# Patient Record
Sex: Male | Born: 1970 | Race: White | Hispanic: No | Smoking: Never smoker
Health system: Southern US, Community
[De-identification: ages and names within clinical notes are randomized; demographics above are authoritative.]

## PROBLEM LIST (undated history)

## (undated) DIAGNOSIS — R011 Cardiac murmur, unspecified: Secondary | ICD-10-CM

## (undated) DIAGNOSIS — J45909 Unspecified asthma, uncomplicated: Secondary | ICD-10-CM

---

## 2001-04-15 ENCOUNTER — Emergency Department (HOSPITAL_COMMUNITY): Admission: EM | Admit: 2001-04-15 | Discharge: 2001-04-16 | Payer: Self-pay | Admitting: *Deleted

## 2001-04-15 ENCOUNTER — Encounter: Payer: Self-pay | Admitting: Emergency Medicine

## 2018-06-30 ENCOUNTER — Encounter (HOSPITAL_BASED_OUTPATIENT_CLINIC_OR_DEPARTMENT_OTHER): Payer: Self-pay

## 2018-06-30 ENCOUNTER — Emergency Department (HOSPITAL_BASED_OUTPATIENT_CLINIC_OR_DEPARTMENT_OTHER)
Admission: EM | Admit: 2018-06-30 | Discharge: 2018-06-30 | Disposition: A | Discharge status: Home / Self Care | Payer: Managed Care, Other (non HMO) | Attending: Emergency Medicine | Admitting: Emergency Medicine

## 2018-06-30 ENCOUNTER — Emergency Department (HOSPITAL_BASED_OUTPATIENT_CLINIC_OR_DEPARTMENT_OTHER): Payer: Managed Care, Other (non HMO)

## 2018-06-30 ENCOUNTER — Other Ambulatory Visit: Payer: Self-pay

## 2018-06-30 DIAGNOSIS — R0989 Other specified symptoms and signs involving the circulatory and respiratory systems: Secondary | ICD-10-CM | POA: Insufficient documentation

## 2018-06-30 MED ORDER — LIDOCAINE VISCOUS HCL 2 % MT SOLN
15.0000 mL | Freq: Once | OROMUCOSAL | Status: AC
  Administered 2018-06-30: 15 mL via OROMUCOSAL
  Filled 2018-06-30: qty 15

## 2018-06-30 MED ORDER — LIDOCAINE VISCOUS HCL 2 % MT SOLN: 15.0000 mL | OROMUCOSAL | 0 refills | Status: DC | PRN

## 2018-06-30 NOTE — ED Notes (Signed)
No drooling Pt. Able to talk plain and is in no reported pain.

## 2018-06-30 NOTE — ED Notes (Signed)
Feels like fish bone in throat  Denies diff swallowing  Or talking

## 2018-06-30 NOTE — ED Notes (Signed)
Ate crackers without diff

## 2018-06-30 NOTE — ED Provider Notes (Signed)
[facility] EMERGENCY DEPARTMENT Provider Note   CSN: [id number] Arrival date & time: 06/30/18  1558    History   Chief Complaint Chief Complaint  Patient presents with  . FB in throat    HPI Johnny Weiss is a 48 y.o. male.     48 year old male with no past medical history presents emergency department for sensation of foreign body in the throat.  Patient reports that he was eating fish around 1 PM and felt like a fishbone might of got caught in his throat.  Reports that he began coughing some.  Denies passing out.  Reports since then he has felt a scratching sensation in the upper posterior part of the left side of his throat.  Denies difficulty breathing or swallowing.  He has been able to tolerate liquids but has not tried any solids yet.     History reviewed. No pertinent past medical history.  There are no active problems to display for this patient.   History reviewed. No pertinent surgical history.      Home Medications    Prior to Admission medications   Medication Sig Start Date End Date Taking? Authorizing Provider  lidocaine (XYLOCAINE) 2 % solution Use as directed 15 mLs in the mouth or throat as needed for mouth pain. 06/30/18   Alveria Apley, PA-C    Family History No family history on file.  Social History Social History   Tobacco Use  . Smoking status: Never Smoker  . Smokeless tobacco: Never Used  Substance Use Topics  . Alcohol use: Yes    Comment: occ  . Drug use: Never     Allergies   Patient has no known allergies.   Review of Systems Review of Systems  Constitutional: Negative for appetite change.  HENT: Positive for sore throat.   Respiratory: Negative for cough, choking, chest tightness and shortness of breath.   Gastrointestinal: Negative for nausea and vomiting.  Allergic/Immunologic: Negative for immunocompromised state.  Neurological: Negative for dizziness and light-headedness.  Hematological: Does not  bruise/bleed easily.     Physical Exam Updated Vital Signs BP (!) 154/91 (BP Location: Left Arm)   Pulse (!) 50 Comment: Per Patient. States that it is normal for him.  Temp 98.8 F (37.1 C) (Oral)   Resp 18   SpO2 100%   Physical Exam Vitals signs and nursing note reviewed.  Constitutional:      Appearance: Normal appearance.  HENT:     Head: Normocephalic.     Mouth/Throat:     Mouth: Mucous membranes are moist.     Tongue: No lesions.     Palate: No mass and lesions.     Pharynx: Oropharynx is clear. No oropharyngeal exudate or posterior oropharyngeal erythema.     Tonsils: No tonsillar exudate or tonsillar abscesses.  Eyes:     Conjunctiva/sclera: Conjunctivae normal.  Neck:     Musculoskeletal: No neck rigidity or pain with movement.     Vascular: No carotid bruit.     Trachea: Trachea and phonation normal. No tracheal tenderness or tracheal deviation.  Pulmonary:     Effort: Pulmonary effort is normal.  Lymphadenopathy:     Cervical: No cervical adenopathy.  Skin:    General: Skin is dry.  Neurological:     Mental Status: He is alert.  Psychiatric:        Mood and Affect: Mood normal.      ED Treatments / Results  Labs (all labs ordered are listed,  but only abnormal results are displayed) Labs Reviewed - No data to display  EKG None  Radiology Dg Neck Soft Tissue  Result Date: 06/30/2018 CLINICAL DATA:  Fishbone caught in throat EXAM: NECK SOFT TISSUES - 1+ VIEW COMPARISON:  None. FINDINGS: There is no evidence of retropharyngeal soft tissue swelling or epiglottic enlargement. The cervical airway is unremarkable and no radio-opaque foreign body identified. Within the scratch set just below the thoracic inlet at the approximate T2 level there is a linear structure which has a thickness of approximately 2 mm and is of uncertain significance. IMPRESSION: 1. No radiopaque foreign body identified within the soft tissues of the neck. At the level of the thoracic  inlet there is a 2 mm thick linear radiopaque structure which appears calcific in density and is of uncertain clinical significance. Recommend PA and lateral radiograph of the chest for further evaluation Electronically Signed   By: Kerby Moors M.D.   On: 06/30/2018 17:28    Procedures Procedures (including critical care time)  Medications Ordered in ED Medications  lidocaine (XYLOCAINE) 2 % viscous mouth solution 15 mL (15 mLs Mouth/Throat Given 06/30/18 1758)     Initial Impression / Assessment and Plan / ED Course  I have reviewed the triage vital signs and the nursing notes.  Pertinent labs & imaging results that were available during my care of the patient were reviewed by me and considered in my medical decision making (see chart for details).  Clinical Course as of Jun 29 1820  Mon Jun 30, 2018  1820 No foreign body consistent with fish bone in his trachea at the level where he is having irritation.  He was able to tolerate both liquids and solids here in the emergency department.  No trouble breathing, eating, swallowing.  There is a linear radiopaque structure at the thoracic inlet and is 2 mm.  In the past this has shown up on x-ray as well and has been consistent with calcification of thyroid cartilage.  Patient is not having any pain or symptoms in this area.  I discussed this with the patient as well.  Will send home with prescription for viscous lidocaine.  Advised on return precautions.   [KM]    Clinical Course User Index [KM] Alveria Apley, PA-C       Based on review of vitals, medical screening exam, lab work and/or imaging, there does not appear to be an acute, emergent etiology for the patient's symptoms. Counseled pt on good return precautions and encouraged both PCP and ED follow-up as needed.  Prior to discharge, I also discussed incidental imaging findings with patient in detail and advised appropriate, recommended follow-up in detail.  Clinical Impression:  1. Sensation of foreign body in throat     Disposition: Discharge  Prior to providing a prescription for a controlled substance, I independently reviewed the patient's recent prescription history on the [place]. The patient had no recent or regular prescriptions and was deemed appropriate for a brief, less than 3 day prescription of narcotic for acute analgesia.  This note was prepared with assistance of Systems analyst. Occasional wrong-word or sound-a-like substitutions may have occurred due to the inherent limitations of voice recognition software.    Final Clinical Impressions(s) / ED Diagnoses   Final diagnoses:  Sensation of foreign body in throat    ED Discharge Orders         Ordered    lidocaine (XYLOCAINE) 2 % solution  As needed     06/30/18 1817           Alveria Apley, PA-C 06/30/18 Tamala Fothergill, DO 06/30/18 1827

## 2018-06-30 NOTE — Discharge Instructions (Addendum)
Thank you for allowing me to care for you today. Please return to the emergency department if you have new or worsening symptoms.   

## 2018-06-30 NOTE — ED Triage Notes (Signed)
Pt states he feels he had a fish bone struck in throat since ~1pm-was seen at [facility] and advised to come to ED-pt NAD-steady gait

## 2020-02-10 DIAGNOSIS — Z23 Encounter for immunization: Secondary | ICD-10-CM | POA: Diagnosis not present

## 2020-04-11 DIAGNOSIS — Z01812 Encounter for preprocedural laboratory examination: Secondary | ICD-10-CM | POA: Diagnosis not present

## 2020-04-14 DIAGNOSIS — D124 Benign neoplasm of descending colon: Secondary | ICD-10-CM | POA: Diagnosis not present

## 2020-04-14 DIAGNOSIS — Z1211 Encounter for screening for malignant neoplasm of colon: Secondary | ICD-10-CM | POA: Diagnosis not present

## 2020-04-14 DIAGNOSIS — K5669 Other partial intestinal obstruction: Secondary | ICD-10-CM | POA: Diagnosis not present

## 2020-04-14 DIAGNOSIS — D127 Benign neoplasm of rectosigmoid junction: Secondary | ICD-10-CM | POA: Diagnosis not present

## 2020-04-14 DIAGNOSIS — K5730 Diverticulosis of large intestine without perforation or abscess without bleeding: Secondary | ICD-10-CM | POA: Diagnosis not present

## 2020-05-30 DIAGNOSIS — K635 Polyp of colon: Secondary | ICD-10-CM | POA: Diagnosis not present

## 2020-06-03 NOTE — Progress Notes (Signed)
Attempted to obtain medical history via telephone, unable to reach at this time. I left a voicemail to return pre surgical testing department's phone call.  

## 2020-06-06 ENCOUNTER — Other Ambulatory Visit (HOSPITAL_COMMUNITY)
Admission: RE | Admit: 2020-06-06 | Discharge: 2020-06-06 | Disposition: A | Discharge status: Home / Self Care | Payer: BC Managed Care – PPO | Source: Ambulatory Visit | Attending: Surgery | Admitting: Surgery

## 2020-06-06 DIAGNOSIS — Z20822 Contact with and (suspected) exposure to covid-19: Secondary | ICD-10-CM | POA: Insufficient documentation

## 2020-06-06 DIAGNOSIS — Z01812 Encounter for preprocedural laboratory examination: Secondary | ICD-10-CM | POA: Insufficient documentation

## 2020-06-06 DIAGNOSIS — D125 Benign neoplasm of sigmoid colon: Secondary | ICD-10-CM | POA: Diagnosis not present

## 2020-06-06 DIAGNOSIS — J45909 Unspecified asthma, uncomplicated: Secondary | ICD-10-CM | POA: Diagnosis not present

## 2020-06-06 LAB — SARS CORONAVIRUS 2 (TAT 6-24 HRS): SARS Coronavirus 2: NEGATIVE

## 2020-06-08 ENCOUNTER — Ambulatory Visit (HOSPITAL_COMMUNITY)
Admission: RE | Admit: 2020-06-08 | Discharge: 2020-06-08 | Disposition: A | Discharge status: Home / Self Care | Payer: BC Managed Care – PPO | Source: Ambulatory Visit | Attending: Surgery | Admitting: Surgery

## 2020-06-08 ENCOUNTER — Encounter (HOSPITAL_COMMUNITY): Payer: Self-pay | Admitting: Surgery

## 2020-06-08 ENCOUNTER — Ambulatory Visit (HOSPITAL_COMMUNITY): Payer: BC Managed Care – PPO | Admitting: Certified Registered"

## 2020-06-08 ENCOUNTER — Other Ambulatory Visit: Payer: Self-pay

## 2020-06-08 ENCOUNTER — Encounter (HOSPITAL_COMMUNITY)
Admission: RE | Disposition: A | Discharge status: Home / Self Care | Payer: Self-pay | Source: Ambulatory Visit | Attending: Surgery

## 2020-06-08 DIAGNOSIS — D125 Benign neoplasm of sigmoid colon: Secondary | ICD-10-CM | POA: Diagnosis not present

## 2020-06-08 DIAGNOSIS — J45909 Unspecified asthma, uncomplicated: Secondary | ICD-10-CM | POA: Diagnosis not present

## 2020-06-08 DIAGNOSIS — Z20822 Contact with and (suspected) exposure to covid-19: Secondary | ICD-10-CM | POA: Diagnosis not present

## 2020-06-08 DIAGNOSIS — D374 Neoplasm of uncertain behavior of colon: Secondary | ICD-10-CM | POA: Diagnosis not present

## 2020-06-08 DIAGNOSIS — K6389 Other specified diseases of intestine: Secondary | ICD-10-CM | POA: Diagnosis not present

## 2020-06-08 HISTORY — PX: BIOPSY: SHX5522

## 2020-06-08 HISTORY — PX: FLEXIBLE SIGMOIDOSCOPY: SHX5431

## 2020-06-08 SURGERY — SIGMOIDOSCOPY, FLEXIBLE
Anesthesia: Monitor Anesthesia Care

## 2020-06-08 MED ORDER — LACTATED RINGERS IV SOLN: INTRAVENOUS | Status: DC

## 2020-06-08 MED ORDER — SODIUM CHLORIDE 0.9 % IV SOLN: INTRAVENOUS | Status: DC

## 2020-06-08 MED ORDER — PROPOFOL 10 MG/ML IV BOLUS
INTRAVENOUS | Status: DC | PRN
  Administered 2020-06-08: 50 mg via INTRAVENOUS
  Administered 2020-06-08: 40 mg via INTRAVENOUS
  Administered 2020-06-08 (×3): 50 mg via INTRAVENOUS

## 2020-06-08 MED ORDER — LIDOCAINE 2% (20 MG/ML) 5 ML SYRINGE
INTRAMUSCULAR | Status: DC | PRN
  Administered 2020-06-08: 40 mg via INTRAVENOUS

## 2020-06-08 NOTE — Op Note (Signed)
[facility] Patient Name: Johnny Weiss Procedure Date: 06/08/2020 MRN: [id number] Attending MD: Ileana Roup MD, MD Date of Birth: 19-Aug-1970 CSN: [id number] Age: 50 Admit Type: Outpatient Procedure:                Flexible Sigmoidoscopy Indications:              Preoperative assessment Providers:                Sharon Mt. Tyreka Henneke MD, MD, Kary Kos RN, RN,                            Laverda Sorenson, Technician, Glenis Smoker, CRNA Referring MD:              Medicines:                Monitored Anesthesia Care Complications:            No immediate complications. Estimated Blood Loss:     Estimated blood loss was minimal. Procedure:                Pre-Anesthesia Assessment:                           - Monitored anesthesia care under the supervision                            of an anesthesiologist was determined to be                            medically necessary for this procedure based on                            review of the patient's medical history,                            medications, and prior anesthesia history.                           After obtaining informed consent, the scope was                            passed under direct vision. The PCF-H190DL                            ([id number]) Olympus pediatric colonscope was                            introduced through the anus and advanced to the the                            splenic flexure. The flexible sigmoidoscopy was                            accomplished without difficulty. The patient                            tolerated the procedure well. The quality  of the                            bowel preparation was adequate. Scope In: Scope Out: Findings:      The perianal and digital rectal examinations were normal. Pertinent       negatives include normal sphincter tone.      A 40 mm polypoid lesion was found in the distal-most sigmoid colon, just       above the proximal-most vavle of  [name]. This is too high for any       attempted trans-anal minimally invasive surgery (TAMIS). GI determined       not candidate for EMR and had referred for surgical evaluation. The       lesion was semi-sessile. No bleeding was present. This was biopsied with       a cold forceps for histology. Verification of patient identification for       the specimen was done by the nurse and technician using the patient's       name, birth date and medical record number. Estimated blood loss was       minimal. Impression:               - Rule out malignancy, polypoid lesion in the                            distal sigmoid colon. Biopsied. Moderate Sedation:      Not Applicable - Patient had care per Anesthesia. Recommendation:           - High fiber diet indefinitely.                           - Return to my office at the next available                            appointment.                           - Discharge patient to home. Procedure Code(s):        --- Professional ---                           [phone], Sigmoidoscopy, flexible; with biopsy, single                            or multiple Diagnosis Code(s):        --- Professional ---                           D49.0, Neoplasm of unspecified behavior of                            digestive system                           Z01.818, Encounter for other preprocedural                            examination CPT copyright 2019 American Medical Association. All rights reserved. The  codes documented in this report are preliminary and upon coder review may  be revised to meet current compliance requirements. [name], MD Ileana Roup MD, MD 06/08/2020 12:43:33 PM This report has been signed electronically. Number of Addenda: 0

## 2020-06-08 NOTE — Anesthesia Preprocedure Evaluation (Signed)
Anesthesia Evaluation  Patient identified by MRN, date of birth, ID band Patient awake    Reviewed: Allergy & Precautions, NPO status , Patient's Chart, lab work & pertinent test results, reviewed documented beta blocker date and time   Airway Mallampati: I  TM Distance: >3 FB Neck ROM: Full    Dental no notable dental hx. (+) Teeth Intact, Dental Advisory Given   Pulmonary neg pulmonary ROS,    Pulmonary exam normal breath sounds clear to auscultation       Cardiovascular negative cardio ROS Normal cardiovascular exam Rhythm:Regular Rate:Normal     Neuro/Psych negative neurological ROS     GI/Hepatic Neg liver ROS, Colon mass   Endo/Other  negative endocrine ROS  Renal/GU negative Renal ROS  negative genitourinary   Musculoskeletal negative musculoskeletal ROS (+)   Abdominal   Peds  Hematology negative hematology ROS (+)   Anesthesia Other Findings   Reproductive/Obstetrics                             Anesthesia Physical Anesthesia Plan  ASA: I  Anesthesia Plan: MAC   Post-op Pain Management:    Induction:   PONV Risk Score and Plan: 1 and Propofol infusion and Treatment may vary due to age or medical condition  Airway Management Planned: Natural Airway, Simple Face Mask and Nasal Cannula  Additional Equipment:   Intra-op Plan:   Post-operative Plan:   Informed Consent: I have reviewed the patients History and Physical, chart, labs and discussed the procedure including the risks, benefits and alternatives for the proposed anesthesia with the patient or authorized representative who has indicated his/her understanding and acceptance.     Dental advisory given  Plan Discussed with: CRNA and Anesthesiologist  Anesthesia Plan Comments:         Anesthesia Quick Evaluation

## 2020-06-08 NOTE — Transfer of Care (Signed)
Immediate Anesthesia Transfer of Care Note  Patient: Johnny Weiss  Procedure(s) Performed: FLEXIBLE SIGMOIDOSCOPY BIOPSY DIAGNOSTIC (N/A ) BIOPSY  Patient Location: PACU and Endoscopy Unit  Anesthesia Type:MAC  Level of Consciousness: awake and alert   Airway & Oxygen Therapy: Patient Spontanous Breathing  Post-op Assessment: Report given to RN and Post -op Vital signs reviewed and stable  Post vital signs: Reviewed and stable  Last Vitals:  Vitals Value Taken Time  BP    Temp    Pulse 54 06/08/20 1242  Resp 18 06/08/20 1242  SpO2 98 % 06/08/20 1242  Vitals shown include unvalidated device data.  Last Pain:  Vitals:   06/08/20 1135  TempSrc: Oral  PainSc: 0-No pain         Complications: No complications documented.

## 2020-06-08 NOTE — Discharge Instructions (Signed)
Flexible Sigmoidoscopy, Care After This sheet gives you information about how to care for yourself after your procedure. Your health care provider may also give you more specific instructions. If you have problems or questions, contact your health care provider. What can I expect after the procedure? After the procedure, it is common to have:  Cramping or pain in your abdomen.  Bloating.  A small amount of blood with your bowel movements. This may happen if a sample of tissue was removed for testing (biopsy). Follow these instructions at home: Eating and drinking  Drink enough fluid to keep your urine pale yellow.  Follow instructions from your health care provider about eating or drinking restrictions.  Resume your normal diet as instructed by your health care provider. Avoid heavy or fried foods that are hard to digest.   Activity  If you were given a medicine to help you relax (sedative) during the procedure, it can affect you for several hours. Do not drive or operate machinery until your health care provider says that it is safe.  Rest as told by your health care provider.  Return to your normal activities as told by your health care provider. Ask your health care provider what activities are safe for you.   General instructions  Take over-the-counter and prescription medicines only as told by your health care provider.  Try walking around when you have cramps or feel bloated.  Keep all follow-up visits as told by your health care provider. This is important. Contact a health care provider if:  You have pain or cramping in your abdomen that gets worse or is not helped with medicine.  You have a small amount of bleeding from your rectum that continues after 24 hours.  You have nausea or vomiting.  You feel weak or dizzy.  You develop a fever. Get help right away if:  You pass large blood clots or see a large amount of blood in the toilet after having a bowel  movement.  You have severe pain in your abdomen.  You have nausea or vomiting for more than 24 hours after the procedure. Summary  After the procedure, you may have cramping or pain in your abdomen or you may have bloating. If you had a sample of tissue removed (biopsy), you may have a small amount of blood with your bowel movements.  Resume your normal diet as instructed by your health care provider. Avoid heavy or fried foods that are hard to digest.  Try walking around when you have cramps or feel bloated.  Get help right away if you pass large blood clots or see a large amount of blood in the toilet after having a bowel movement. This information is not intended to replace advice given to you by your health care provider. Make sure you discuss any questions you have with your health care provider. Document Revised: 01/05/2019 Document Reviewed: 01/05/2019 Elsevier Patient Education  2021 Elsevier Inc.  

## 2020-06-08 NOTE — Anesthesia Procedure Notes (Signed)
Procedure Name: MAC Date/Time: 06/08/2020 12:05 PM Performed by: Cynda Familia, CRNA Pre-anesthesia Checklist: Patient identified, Emergency Drugs available, Suction available, Patient being monitored and Timeout performed Patient Re-evaluated:Patient Re-evaluated prior to induction Oxygen Delivery Method: Simple face mask Placement Confirmation: positive ETCO2 and breath sounds checked- equal and bilateral Dental Injury: Teeth and Oropharynx as per pre-operative assessment

## 2020-06-08 NOTE — H&P (Signed)
CC: Here today for flex sig  HPI: Mr. Handel is a very pleasant 8yoM who underwent his first colonoscopy for screening purposes with Dr. Paulita Fujita 04/14/20. 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 This was tattooed. He was subsequently referred to see [place]. He has denied complaints or any symptoms related to this. He denies any history of rectal bleeding, blood in his stool, appetite or weight changes. He also denies any history of groin pain since I saw him in 2019. He reports a stable reducible bulge on the left side  Denies any changes in his health or health history since we met in the office. States he is ready for the flexible sigmoidoscopy.  PMH: Denies  PSH: Wisdom teeth surgery  FHx: Denies FHx of malignancy  Social: Denies use of tobacco/drugs; social EtOH use. He works as an Copy.  ROS: A comprehensive 10 system review of systems was completed with the patient and pertinent findings as noted above.   Past Surgical History Oral Surgery   Diagnostic Studies History Colonoscopy  never  Allergies Allergies Reconciled  No Known Drug Allergies  Medication History No Current Medications Medications Reconciled  Other Problems Asthma  Inguinal Hernia     Review of Systems General Not Present- Appetite Loss, Chills, Fatigue, Fever, Night Sweats, Weight Gain and Weight Loss. Skin Not Present- Change in Wart/Mole, Dryness, Hives, Jaundice, New Lesions, Non-Healing Wounds, Rash and Ulcer. HEENT Not Present- Earache, Hearing Loss, Hoarseness, Nose Bleed, Oral Ulcers, Ringing in the Ears, Seasonal Allergies, Sinus Pain, Sore Throat, Visual Disturbances, Wears glasses/contact lenses and Yellow Eyes. Respiratory Not Present- Bloody sputum, Chronic Cough, Difficulty Breathing,  Snoring and Wheezing. Breast Not Present- Breast Mass, Breast Pain, Nipple Discharge and Skin Changes. Cardiovascular Not Present- Chest Pain, Difficulty Breathing Lying Down, Leg Cramps, Palpitations, Rapid Heart Rate, Shortness of Breath and Swelling of Extremities. Gastrointestinal Not Present- Abdominal Pain, Bloating, Bloody Stool, Change in Bowel Habits, Chronic diarrhea, Constipation, Difficulty Swallowing, Excessive gas, Gets full quickly at meals, Hemorrhoids, Indigestion, Nausea, Rectal Pain and Vomiting. Male Genitourinary Not Present- Blood in Urine, Change in Urinary Stream, Frequency, Impotence, Nocturia, Painful Urination, Urgency and Urine Leakage. Psychiatric Not Present- Anxiety and Depression. Endocrine Not Present- Appetite Changes and Cold Intolerance. Hematology Not Present- Abnormal Bleeding and Blood Clots.   Physical Exam   Constitutional: No acute distress; conversant; wearing mask Eyes: Moist conjunctiva; no lid lag; anicteric sclerae; pupils equal and round Lungs: Normal respiratory effort CV: rrr; no pitting edema GI: Abdomen soft, nontender, nondistended; no palpable hepatosplenomegaly. Left inguinal hernia, self reduces when flat, no scrotal component, no overlying skin changes. Right groin with defect in inguinal floor, ?small right inguinal hernia MSK: Normal gait; no clubbing/cyanosis Psychiatric: Appropriate affect; alert and oriented 3 Lymphatic: No palpable cervical or axillary lymphadenopathy    Assessment & Plan  Mr. 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 -We discussed the somewhat nuanced approach to the management of polypoid lesions in the rectosigmoid colon, largely based upon it's relation to the upper valve of [name],  specific size, endoscopic appearance, among others. We therefore discussed proceeding with an urgent flexible  sigmoidoscopy for further endoscopic evaluation, surgical planning, and additional biopsies. -We discussed the based on results of this procedure, additional potential procedures to address this varying from transanal minimally invasive surgery to transabdominal surgery including robotic sigmoidectomy/low anterior resection. We discussed that if there is concern for adenocarcinoma for this on biopsies, staging CT chest/having/pelvis. We will go ahead and work on securing a date for his subsequent procedure to help expedite things as well  [name], MD [facility] Use [email or web address] to contact on call provider

## 2020-06-08 NOTE — Anesthesia Postprocedure Evaluation (Signed)
Anesthesia Post Note  Patient: Johnny Weiss  Procedure(s) Performed: FLEXIBLE SIGMOIDOSCOPY BIOPSY DIAGNOSTIC (N/A ) BIOPSY     Patient location during evaluation: PACU Anesthesia Type: MAC Level of consciousness: awake and alert and oriented Pain management: pain level controlled Vital Signs Assessment: post-procedure vital signs reviewed and stable Respiratory status: spontaneous breathing, nonlabored ventilation and respiratory function stable Cardiovascular status: stable and blood pressure returned to baseline Postop Assessment: no apparent nausea or vomiting Anesthetic complications: no   No complications documented.  Last Vitals:  Vitals:   06/08/20 1135 06/08/20 1247  BP: (!) 180/78 (!) 144/96  Pulse: (!) 52 (!) 57  Resp: 16 16  Temp: 36.5 C   SpO2: 100% 96%    Last Pain:  Vitals:   06/08/20 1247  TempSrc: Axillary  PainSc: 0-No pain                 Corey Caulfield A.

## 2020-06-09 ENCOUNTER — Encounter (HOSPITAL_COMMUNITY): Payer: Self-pay | Admitting: Surgery

## 2020-06-09 LAB — SURGICAL PATHOLOGY

## 2020-06-13 DIAGNOSIS — K635 Polyp of colon: Secondary | ICD-10-CM | POA: Diagnosis not present

## 2020-07-11 ENCOUNTER — Ambulatory Visit: Payer: Self-pay | Admitting: Surgery

## 2020-07-12 ENCOUNTER — Other Ambulatory Visit (HOSPITAL_COMMUNITY): Payer: Self-pay

## 2020-07-12 NOTE — Progress Notes (Signed)
DUE TO COVID-19 ONLY ONE VISITOR IS ALLOWED TO COME WITH YOU AND STAY IN THE WAITING ROOM ONLY DURING PRE OP AND PROCEDURE DAY OF SURGERY. THE 1 VISITOR  MAY VISIT WITH YOU AFTER SURGERY IN YOUR PRIVATE ROOM DURING VISITING HOURS ONLY!  YOU NEED TO HAVE A COVID 19 TEST ON___6/27/22____ @_______ , THIS TEST MUST BE DONE BEFORE SURGERY,  COVID TESTING SITE [address] [place] [address], IT IS ON THE RIGHT GOING OUT [address] APPROXIMATELY  2 MINUTES PAST [employer] ON THE RIGHT. ONCE YOUR COVID TEST IS COMPLETED,  PLEASE BEGIN THE QUARANTINE INSTRUCTIONS AS OUTLINED IN YOUR HANDOUT.                Johnny Weiss  07/12/2020   Your procedure is scheduled on:  07/21/2020   Report to [facility] Main  Entrance   Report to admitting at     [phone]     Call this number if you have problems the morning of surgery [phone]           REMEMBER: FOLLOW ALL YOUR BOWEL PREP INSTRUCTIONS WITH CLEAR LIQUIDS FROM YOUR SURGEON'S INSTRUCTIONS. DRINK 2 PRESURGERY ENSURE DRINKS THE NIGHT BEFORE SURGERY AT 1000 PM AND 1 PRESURGERY DRINK THE DAY OF THE PROCEDURE 3 HOURS PRIOR TO SCHEDULED SURGERY.  NOTHING BY MOUTH EXCEPT CLEAR LIQUIDS UNTIL THREE HOURS PRIOR TO SCHEDULED SURGERY. PLEASE FINISH PRESURGERY 3RD  ENSURE  DRINK PER SURGEON ORDER 3 HOURS PRIOR TO SCHEDULED SURGERY TIME WHICH NEEDS TO BE COMPLETED AT __0415 am _______.     CLEAR LIQUID DIET   Foods Allowed                                                                      Coffee and tea, regular and decaf                           Plain Jell-O any favor except red or purple                                         Fruit ices (not with fruit pulp)                                      Iced Popsicles                                     Carbonated beverages, regular and diet                                    Cranberry, grape and apple juices Sports drinks like Gatorade Lightly seasoned clear broth or  consume(fat free) Sugar, honey syrup  _____________________________________________________________________      BRUSH YOUR TEETH MORNING OF SURGERY AND RINSE YOUR MOUTH OUT, NO CHEWING GUM CANDY OR MINTS.     Take these  medicines the morning of surgery with A SIP OF WATER:  none   DO NOT TAKE ANY DIABETIC MEDICATIONS DAY OF YOUR SURGERY                               You may not have any metal on your body including hair pins and              piercings  Do not wear jewelry, make-up, lotions, powders or perfumes, deodorant             Do not wear nail polish on your fingernails.  Do not shave  48 hours prior to surgery.              Men may shave face and neck.   Do not bring valuables to the hospital. [place].  Contacts, dentures or bridgework may not be worn into surgery.  Leave suitcase in the car. After surgery it may be brought to your room.                 Please read over the following fact sheets you were given: _____________________________________________________________________  [facility] - Preparing for Surgery Before surgery, you can play an important role.  Because skin is not sterile, your skin needs to be as free of germs as possible.  You can reduce the number of germs on your skin by washing with CHG (chlorahexidine gluconate) soap before surgery.  CHG is an antiseptic cleaner which kills germs and bonds with the skin to continue killing germs even after washing. Please DO NOT use if you have an allergy to CHG or antibacterial soaps.  If your skin becomes reddened/irritated stop using the CHG and inform your nurse when you arrive at Short Stay. Do not shave (including legs and underarms) for at least 48 hours prior to the first CHG shower.  You may shave your face/neck. Please follow these instructions carefully:  1.  Shower with CHG Soap the night before surgery and the  morning of Surgery.  2.  If you choose to  wash your hair, wash your hair first as usual with your  normal  shampoo.  3.  After you shampoo, rinse your hair and body thoroughly to remove the  shampoo.                           4.  Use CHG as you would any other liquid soap.  You can apply chg directly  to the skin and wash                       Gently with a scrungie or clean washcloth.  5.  Apply the CHG Soap to your body ONLY FROM THE NECK DOWN.   Do not use on face/ open                           Wound or open sores. Avoid contact with eyes, ears mouth and genitals (private parts).                       Wash face,  Genitals (private parts) with your normal soap.  6.  Wash thoroughly, paying special attention to the area where your surgery  will be performed.  7.  Thoroughly rinse your body with warm water from the neck down.  8.  DO NOT shower/wash with your normal soap after using and rinsing off  the CHG Soap.                9.  Pat yourself dry with a clean towel.            10.  Wear clean pajamas.            11.  Place clean sheets on your bed the night of your first shower and do not  sleep with pets. Day of Surgery : Do not apply any lotions/deodorants the morning of surgery.  Please wear clean clothes to the hospital/surgery center.  FAILURE TO FOLLOW THESE INSTRUCTIONS MAY RESULT IN THE CANCELLATION OF YOUR SURGERY PATIENT SIGNATURE_________________________________  NURSE SIGNATURE__________________________________  ________________________________________________________________________

## 2020-07-13 ENCOUNTER — Other Ambulatory Visit: Payer: Self-pay

## 2020-07-13 ENCOUNTER — Encounter (HOSPITAL_COMMUNITY)
Admission: RE | Admit: 2020-07-13 | Discharge: 2020-07-13 | Disposition: A | Discharge status: Home / Self Care | Payer: BC Managed Care – PPO | Source: Ambulatory Visit | Attending: Surgery | Admitting: Surgery

## 2020-07-13 ENCOUNTER — Encounter (HOSPITAL_COMMUNITY): Payer: Self-pay

## 2020-07-13 DIAGNOSIS — Z01818 Encounter for other preprocedural examination: Secondary | ICD-10-CM | POA: Insufficient documentation

## 2020-07-13 HISTORY — DX: Cardiac murmur, unspecified: R01.1

## 2020-07-13 HISTORY — DX: Unspecified asthma, uncomplicated: J45.909

## 2020-07-13 LAB — CBC WITH DIFFERENTIAL/PLATELET
Abs Immature Granulocytes: 0.02 10*3/uL (ref 0.00–0.07)
Basophils Absolute: 0 10*3/uL (ref 0.0–0.1)
Basophils Relative: 0 %
Eosinophils Absolute: 0.2 10*3/uL (ref 0.0–0.5)
Eosinophils Relative: 2 %
HCT: 43.2 % (ref 39.0–52.0)
Hemoglobin: 14.6 g/dL (ref 13.0–17.0)
Immature Granulocytes: 0 %
Lymphocytes Relative: 29 %
Lymphs Abs: 1.8 10*3/uL (ref 0.7–4.0)
MCH: 31.1 pg (ref 26.0–34.0)
MCHC: 33.8 g/dL (ref 30.0–36.0)
MCV: 92.1 fL (ref 80.0–100.0)
Monocytes Absolute: 0.6 10*3/uL (ref 0.1–1.0)
Monocytes Relative: 10 %
Neutro Abs: 3.7 10*3/uL (ref 1.7–7.7)
Neutrophils Relative %: 59 %
Platelets: 314 10*3/uL (ref 150–400)
RBC: 4.69 MIL/uL (ref 4.22–5.81)
RDW: 12.2 % (ref 11.5–15.5)
WBC: 6.3 10*3/uL (ref 4.0–10.5)
nRBC: 0 % (ref 0.0–0.2)

## 2020-07-13 LAB — COMPREHENSIVE METABOLIC PANEL
ALT: 29 U/L (ref 0–44)
AST: 22 U/L (ref 15–41)
Albumin: 4.2 g/dL (ref 3.5–5.0)
Alkaline Phosphatase: 53 U/L (ref 38–126)
Anion gap: 6 (ref 5–15)
BUN: 16 mg/dL (ref 6–20)
CO2: 28 mmol/L (ref 22–32)
Calcium: 9.2 mg/dL (ref 8.9–10.3)
Chloride: 104 mmol/L (ref 98–111)
Creatinine, Ser: 0.9 mg/dL (ref 0.61–1.24)
GFR, Estimated: >60 mL/min (ref >60)
Glucose, Bld: 95 mg/dL (ref 70–99)
Potassium: 4.4 mmol/L (ref 3.5–5.1)
Sodium: 138 mmol/L (ref 135–145)
Total Bilirubin: 0.4 mg/dL (ref 0.3–1.2)
Total Protein: 7.4 g/dL (ref 6.5–8.1)

## 2020-07-13 LAB — PROTIME-INR
INR: 0.9 (ref 0.8–1.2)
Prothrombin Time: 12.2 seconds (ref 11.4–15.2)

## 2020-07-13 LAB — HEMOGLOBIN A1C
Hgb A1c MFr Bld: 5.9 % — ABNORMAL HIGH (ref 4.8–5.6)
Mean Plasma Glucose: 122.63 mg/dL

## 2020-07-13 NOTE — Progress Notes (Addendum)
Anesthesia Review:  PCP:  DR Jonathon Jordan  Called and requested most recent office visit note on 07/13/20.  11/04/19 LOV note on chart  Cardiologist : none  Chest x-ray : EKG : 07/13/20  Echo : Stress test: Cardiac Cath :  Activity level: can do a flight of stairs without difficulty  Sleep Study/ CPAP : none  Fasting Blood Sugar :      / Checks Blood Sugar -- times a day:   Blood Thinner/ Instructions /Last Dose: ASA / Instructions/ Last Dose :   Heart murmur as a child - not followed by a cardiologist and asthma as a child  Hgba1c-5.9 on 07/13/20

## 2020-07-13 NOTE — Progress Notes (Addendum)
Called [facility] and spoke iwht Triage and asked regarding wound/ostomy nurse consult order.  Spoke with Abigail Butts on 07/12/20 and she will contact Dr Dema Severin.  No order in for ostomy nurse consult on 07/13/20.  Called and spoke with Maudie Flakes ( stomy nurse) and she stated pt needed to be marked and she would be here at 1030 am in PST to mark pt.   AT 1030am  laurie McNichol , RN came to PST and stated she had heard from Dr Dema Severin and he did not want pt to be marked prior to surgery.

## 2020-07-18 ENCOUNTER — Other Ambulatory Visit (HOSPITAL_COMMUNITY)
Admission: RE | Admit: 2020-07-18 | Discharge: 2020-07-18 | Disposition: A | Discharge status: Home / Self Care | Payer: BC Managed Care – PPO | Source: Ambulatory Visit | Attending: Surgery | Admitting: Surgery

## 2020-07-18 DIAGNOSIS — D127 Benign neoplasm of rectosigmoid junction: Secondary | ICD-10-CM | POA: Diagnosis not present

## 2020-07-18 DIAGNOSIS — Z01812 Encounter for preprocedural laboratory examination: Secondary | ICD-10-CM | POA: Insufficient documentation

## 2020-07-18 DIAGNOSIS — D124 Benign neoplasm of descending colon: Secondary | ICD-10-CM | POA: Diagnosis not present

## 2020-07-18 DIAGNOSIS — J45909 Unspecified asthma, uncomplicated: Secondary | ICD-10-CM | POA: Diagnosis not present

## 2020-07-18 DIAGNOSIS — R1900 Intra-abdominal and pelvic swelling, mass and lump, unspecified site: Secondary | ICD-10-CM | POA: Diagnosis not present

## 2020-07-18 DIAGNOSIS — Q438 Other specified congenital malformations of intestine: Secondary | ICD-10-CM | POA: Diagnosis not present

## 2020-07-18 DIAGNOSIS — K6389 Other specified diseases of intestine: Secondary | ICD-10-CM | POA: Diagnosis not present

## 2020-07-18 DIAGNOSIS — Z20822 Contact with and (suspected) exposure to covid-19: Secondary | ICD-10-CM | POA: Diagnosis not present

## 2020-07-18 DIAGNOSIS — D125 Benign neoplasm of sigmoid colon: Secondary | ICD-10-CM | POA: Diagnosis not present

## 2020-07-19 LAB — SARS CORONAVIRUS 2 (TAT 6-24 HRS): SARS Coronavirus 2: NEGATIVE

## 2020-07-20 MED ORDER — BUPIVACAINE LIPOSOME 1.3 % IJ SUSP
20.0000 mL | INTRAMUSCULAR | Status: DC
  Filled 2020-07-20: qty 20

## 2020-07-20 MED ORDER — SODIUM CHLORIDE 0.9 % IV SOLN
2.0000 g | INTRAVENOUS | Status: AC
  Administered 2020-07-21: 2 g via INTRAVENOUS
  Filled 2020-07-20: qty 2

## 2020-07-20 NOTE — Anesthesia Preprocedure Evaluation (Addendum)
Anesthesia Evaluation  Patient identified by MRN, date of birth, ID band Patient awake    Reviewed: Allergy & Precautions, H&P , NPO status , Patient's Chart, lab work & pertinent test results  Airway Mallampati: III  TM Distance: >3 FB Neck ROM: Full    Dental no notable dental hx. (+) Teeth Intact, Dental Advisory Given   Pulmonary asthma ,    Pulmonary exam normal breath sounds clear to auscultation       Cardiovascular Exercise Tolerance: Good negative cardio ROS   Rhythm:Regular Rate:Normal     Neuro/Psych negative neurological ROS  negative psych ROS   GI/Hepatic negative GI ROS, Neg liver ROS,   Endo/Other  negative endocrine ROS  Renal/GU negative Renal ROS  negative genitourinary   Musculoskeletal   Abdominal   Peds  Hematology negative hematology ROS (+)   Anesthesia Other Findings   Reproductive/Obstetrics negative OB ROS                            Anesthesia Physical Anesthesia Plan  ASA: 2  Anesthesia Plan: General   Post-op Pain Management:    Induction: Intravenous  PONV Risk Score and Plan: 3 and Ondansetron, Dexamethasone and Midazolam  Airway Management Planned: Oral ETT  Additional Equipment:   Intra-op Plan:   Post-operative Plan: Extubation in OR  Informed Consent: I have reviewed the patients History and Physical, chart, labs and discussed the procedure including the risks, benefits and alternatives for the proposed anesthesia with the patient or authorized representative who has indicated his/her understanding and acceptance.     Dental advisory given  Plan Discussed with: CRNA  Anesthesia Plan Comments:        Anesthesia Quick Evaluation

## 2020-07-21 ENCOUNTER — Encounter (HOSPITAL_COMMUNITY)
Admission: RE | Disposition: A | Discharge status: Home / Self Care | Payer: Self-pay | Source: Home / Self Care | Attending: Surgery

## 2020-07-21 ENCOUNTER — Inpatient Hospital Stay (HOSPITAL_COMMUNITY): Payer: BC Managed Care – PPO | Admitting: Physician Assistant

## 2020-07-21 ENCOUNTER — Other Ambulatory Visit: Payer: Self-pay

## 2020-07-21 ENCOUNTER — Encounter (HOSPITAL_COMMUNITY): Payer: Self-pay | Admitting: Surgery

## 2020-07-21 ENCOUNTER — Inpatient Hospital Stay (HOSPITAL_COMMUNITY)
Admission: RE | Admit: 2020-07-21 | Discharge: 2020-07-23 | DRG: 333 | Disposition: A | Discharge status: Home / Self Care | Payer: BC Managed Care – PPO | Attending: Surgery | Admitting: Surgery

## 2020-07-21 ENCOUNTER — Inpatient Hospital Stay (HOSPITAL_COMMUNITY): Payer: BC Managed Care – PPO | Admitting: Registered Nurse

## 2020-07-21 DIAGNOSIS — D125 Benign neoplasm of sigmoid colon: Secondary | ICD-10-CM | POA: Diagnosis present

## 2020-07-21 DIAGNOSIS — J45909 Unspecified asthma, uncomplicated: Secondary | ICD-10-CM | POA: Diagnosis present

## 2020-07-21 DIAGNOSIS — Z20822 Contact with and (suspected) exposure to covid-19: Secondary | ICD-10-CM | POA: Diagnosis present

## 2020-07-21 DIAGNOSIS — Q438 Other specified congenital malformations of intestine: Secondary | ICD-10-CM

## 2020-07-21 DIAGNOSIS — R1900 Intra-abdominal and pelvic swelling, mass and lump, unspecified site: Secondary | ICD-10-CM | POA: Diagnosis present

## 2020-07-21 DIAGNOSIS — K6389 Other specified diseases of intestine: Secondary | ICD-10-CM | POA: Diagnosis present

## 2020-07-21 DIAGNOSIS — D124 Benign neoplasm of descending colon: Secondary | ICD-10-CM | POA: Diagnosis present

## 2020-07-21 HISTORY — PX: FLEXIBLE SIGMOIDOSCOPY: SHX5431

## 2020-07-21 HISTORY — PX: XI ROBOTIC ASSISTED LOWER ANTERIOR RESECTION: SHX6558

## 2020-07-21 LAB — TYPE AND SCREEN
ABO/RH(D): B POS
Antibody Screen: NEGATIVE

## 2020-07-21 LAB — ABO/RH: ABO/RH(D): B POS

## 2020-07-21 SURGERY — RESECTION, RECTUM, LOW ANTERIOR, ROBOT-ASSISTED
Anesthesia: General

## 2020-07-21 MED ORDER — HYDRALAZINE HCL 20 MG/ML IJ SOLN: 10.0000 mg | INTRAMUSCULAR | Status: DC | PRN

## 2020-07-21 MED ORDER — ENSURE PRE-SURGERY PO LIQD
296.0000 mL | Freq: Once | ORAL | Status: DC
  Filled 2020-07-21: qty 296

## 2020-07-21 MED ORDER — ACETAMINOPHEN 500 MG PO TABS: 1000.0000 mg | ORAL_TABLET | ORAL | Status: DC

## 2020-07-21 MED ORDER — NEOMYCIN SULFATE 500 MG PO TABS: 1000.0000 mg | ORAL_TABLET | ORAL | Status: DC

## 2020-07-21 MED ORDER — DIPHENHYDRAMINE HCL 50 MG/ML IJ SOLN: 12.5000 mg | Freq: Four times a day (QID) | INTRAMUSCULAR | Status: DC | PRN

## 2020-07-21 MED ORDER — DEXAMETHASONE SODIUM PHOSPHATE 10 MG/ML IJ SOLN
INTRAMUSCULAR | Status: AC
  Filled 2020-07-21: qty 1

## 2020-07-21 MED ORDER — MIDAZOLAM HCL 2 MG/2ML IJ SOLN
INTRAMUSCULAR | Status: AC
  Filled 2020-07-21: qty 2

## 2020-07-21 MED ORDER — LACTATED RINGERS IV SOLN: INTRAVENOUS | Status: DC | PRN

## 2020-07-21 MED ORDER — PROPOFOL 10 MG/ML IV BOLUS
INTRAVENOUS | Status: AC
  Filled 2020-07-21: qty 20

## 2020-07-21 MED ORDER — ENSURE SURGERY PO LIQD
237.0000 mL | Freq: Two times a day (BID) | ORAL | Status: DC
  Administered 2020-07-21 – 2020-07-22 (×3): 237 mL via ORAL

## 2020-07-21 MED ORDER — SIMETHICONE 80 MG PO CHEW: 40.0000 mg | CHEWABLE_TABLET | Freq: Four times a day (QID) | ORAL | Status: DC | PRN

## 2020-07-21 MED ORDER — HYDROMORPHONE HCL 1 MG/ML IJ SOLN
0.5000 mg | INTRAMUSCULAR | Status: DC | PRN
Start: 2020-07-21 — End: 2020-07-23

## 2020-07-21 MED ORDER — ALUM & MAG HYDROXIDE-SIMETH 200-200-20 MG/5ML PO SUSP: 30.0000 mL | Freq: Four times a day (QID) | ORAL | Status: DC | PRN

## 2020-07-21 MED ORDER — KETAMINE HCL 10 MG/ML IJ SOLN
INTRAMUSCULAR | Status: AC
  Filled 2020-07-21: qty 1

## 2020-07-21 MED ORDER — PROPOFOL 10 MG/ML IV BOLUS
INTRAVENOUS | Status: DC | PRN
  Administered 2020-07-21: 200 mg via INTRAVENOUS

## 2020-07-21 MED ORDER — ALVIMOPAN 12 MG PO CAPS
12.0000 mg | ORAL_CAPSULE | Freq: Two times a day (BID) | ORAL | Status: DC
  Administered 2020-07-22: 12 mg via ORAL
  Filled 2020-07-21 (×2): qty 1

## 2020-07-21 MED ORDER — PHENYLEPHRINE HCL (PRESSORS) 10 MG/ML IV SOLN
INTRAVENOUS | Status: AC
  Filled 2020-07-21: qty 2

## 2020-07-21 MED ORDER — LORATADINE 10 MG PO TABS
10.0000 mg | ORAL_TABLET | Freq: Every day | ORAL | Status: DC
  Administered 2020-07-22 – 2020-07-23 (×2): 10 mg via ORAL
  Filled 2020-07-21 (×2): qty 1

## 2020-07-21 MED ORDER — MIDAZOLAM HCL 5 MG/5ML IJ SOLN
INTRAMUSCULAR | Status: DC | PRN
  Administered 2020-07-21: 2 mg via INTRAVENOUS

## 2020-07-21 MED ORDER — ONDANSETRON HCL 4 MG PO TABS: 4.0000 mg | ORAL_TABLET | Freq: Four times a day (QID) | ORAL | Status: DC | PRN

## 2020-07-21 MED ORDER — KETAMINE HCL 10 MG/ML IJ SOLN
INTRAMUSCULAR | Status: DC | PRN
  Administered 2020-07-21 (×2): 15 mg via INTRAVENOUS

## 2020-07-21 MED ORDER — LIDOCAINE 2% (20 MG/ML) 5 ML SYRINGE
INTRAMUSCULAR | Status: DC | PRN
  Administered 2020-07-21: 60 mg via INTRAVENOUS
  Administered 2020-07-21: 1.5 mg/kg/h via INTRAVENOUS

## 2020-07-21 MED ORDER — BUPIVACAINE LIPOSOME 1.3 % IJ SUSP
INTRAMUSCULAR | Status: DC | PRN
  Administered 2020-07-21: 20 mL

## 2020-07-21 MED ORDER — 0.9 % SODIUM CHLORIDE (POUR BTL) OPTIME
TOPICAL | Status: DC | PRN
  Administered 2020-07-21: 2000 mL

## 2020-07-21 MED ORDER — ACETAMINOPHEN 500 MG PO TABS
1000.0000 mg | ORAL_TABLET | Freq: Four times a day (QID) | ORAL | Status: DC
  Administered 2020-07-21 – 2020-07-23 (×8): 1000 mg via ORAL
  Filled 2020-07-21 (×9): qty 2

## 2020-07-21 MED ORDER — BISACODYL 5 MG PO TBEC: 20.0000 mg | DELAYED_RELEASE_TABLET | Freq: Once | ORAL | Status: DC

## 2020-07-21 MED ORDER — CHLORHEXIDINE GLUCONATE CLOTH 2 % EX PADS: 6.0000 | MEDICATED_PAD | Freq: Once | CUTANEOUS | Status: DC

## 2020-07-21 MED ORDER — ONDANSETRON HCL 4 MG/2ML IJ SOLN
INTRAMUSCULAR | Status: AC
  Filled 2020-07-21: qty 2

## 2020-07-21 MED ORDER — HYDROMORPHONE HCL 1 MG/ML IJ SOLN: 0.2500 mg | INTRAMUSCULAR | Status: DC | PRN

## 2020-07-21 MED ORDER — FENTANYL CITRATE (PF) 100 MCG/2ML IJ SOLN
INTRAMUSCULAR | Status: DC | PRN
  Administered 2020-07-21 (×5): 50 ug via INTRAVENOUS

## 2020-07-21 MED ORDER — ROCURONIUM BROMIDE 10 MG/ML (PF) SYRINGE
PREFILLED_SYRINGE | INTRAVENOUS | Status: AC
  Filled 2020-07-21: qty 10

## 2020-07-21 MED ORDER — POLYETHYLENE GLYCOL 3350 17 GM/SCOOP PO POWD: 1.0000 | Freq: Once | ORAL | Status: DC

## 2020-07-21 MED ORDER — DIPHENHYDRAMINE HCL 12.5 MG/5ML PO ELIX: 12.5000 mg | ORAL_SOLUTION | Freq: Four times a day (QID) | ORAL | Status: DC | PRN

## 2020-07-21 MED ORDER — DEXAMETHASONE SODIUM PHOSPHATE 10 MG/ML IJ SOLN
INTRAMUSCULAR | Status: DC | PRN
  Administered 2020-07-21: 10 mg via INTRAVENOUS

## 2020-07-21 MED ORDER — TRAMADOL HCL 50 MG PO TABS: 50.0000 mg | ORAL_TABLET | Freq: Four times a day (QID) | ORAL | Status: DC | PRN

## 2020-07-21 MED ORDER — BUPIVACAINE-EPINEPHRINE (PF) 0.25% -1:200000 IJ SOLN
INTRAMUSCULAR | Status: DC | PRN
  Administered 2020-07-21: 30 mL via PERINEURAL

## 2020-07-21 MED ORDER — SODIUM CHLORIDE (PF) 0.9 % IJ SOLN
INTRAMUSCULAR | Status: AC
  Filled 2020-07-21: qty 20

## 2020-07-21 MED ORDER — ALVIMOPAN 12 MG PO CAPS
12.0000 mg | ORAL_CAPSULE | ORAL | Status: AC
  Administered 2020-07-21: 12 mg via ORAL
  Filled 2020-07-21: qty 1

## 2020-07-21 MED ORDER — FENTANYL CITRATE (PF) 100 MCG/2ML IJ SOLN
INTRAMUSCULAR | Status: AC
  Filled 2020-07-21: qty 2

## 2020-07-21 MED ORDER — ONDANSETRON HCL 4 MG/2ML IJ SOLN: 4.0000 mg | Freq: Four times a day (QID) | INTRAMUSCULAR | Status: DC | PRN

## 2020-07-21 MED ORDER — ACETAMINOPHEN 500 MG PO TABS
1000.0000 mg | ORAL_TABLET | Freq: Once | ORAL | Status: AC
  Administered 2020-07-21: 1000 mg via ORAL
  Filled 2020-07-21: qty 2

## 2020-07-21 MED ORDER — LIDOCAINE HCL 2 % IJ SOLN
INTRAMUSCULAR | Status: AC
  Filled 2020-07-21: qty 20

## 2020-07-21 MED ORDER — BUPIVACAINE-EPINEPHRINE (PF) 0.25% -1:200000 IJ SOLN
INTRAMUSCULAR | Status: AC
  Filled 2020-07-21: qty 30

## 2020-07-21 MED ORDER — ENSURE PRE-SURGERY PO LIQD
592.0000 mL | Freq: Once | ORAL | Status: DC
  Filled 2020-07-21: qty 592

## 2020-07-21 MED ORDER — IBUPROFEN 400 MG PO TABS: 600.0000 mg | ORAL_TABLET | Freq: Four times a day (QID) | ORAL | Status: DC | PRN

## 2020-07-21 MED ORDER — SUGAMMADEX SODIUM 200 MG/2ML IV SOLN
INTRAVENOUS | Status: DC | PRN
  Administered 2020-07-21: 160 mg via INTRAVENOUS

## 2020-07-21 MED ORDER — ONDANSETRON HCL 4 MG/2ML IJ SOLN
INTRAMUSCULAR | Status: DC | PRN
  Administered 2020-07-21: 4 mg via INTRAVENOUS

## 2020-07-21 MED ORDER — METRONIDAZOLE 500 MG PO TABS: 1000.0000 mg | ORAL_TABLET | ORAL | Status: DC

## 2020-07-21 MED ORDER — HEPARIN SODIUM (PORCINE) 5000 UNIT/ML IJ SOLN
5000.0000 [IU] | Freq: Three times a day (TID) | INTRAMUSCULAR | Status: DC
  Administered 2020-07-21 – 2020-07-23 (×6): 5000 [IU] via SUBCUTANEOUS
  Filled 2020-07-21 (×6): qty 1

## 2020-07-21 MED ORDER — LACTATED RINGERS IV SOLN: INTRAVENOUS | Status: DC

## 2020-07-21 MED ORDER — HEPARIN SODIUM (PORCINE) 5000 UNIT/ML IJ SOLN
5000.0000 [IU] | Freq: Once | INTRAMUSCULAR | Status: AC
  Administered 2020-07-21: 5000 [IU] via SUBCUTANEOUS
  Filled 2020-07-21: qty 1

## 2020-07-21 MED ORDER — LIDOCAINE 2% (20 MG/ML) 5 ML SYRINGE
INTRAMUSCULAR | Status: AC
  Filled 2020-07-21: qty 5

## 2020-07-21 MED ORDER — ROCURONIUM BROMIDE 10 MG/ML (PF) SYRINGE
PREFILLED_SYRINGE | INTRAVENOUS | Status: DC | PRN
  Administered 2020-07-21: 60 mg via INTRAVENOUS
  Administered 2020-07-21 (×2): 20 mg via INTRAVENOUS

## 2020-07-21 MED ORDER — LACTATED RINGERS IR SOLN
Status: DC | PRN
  Administered 2020-07-21: 1

## 2020-07-21 MED ORDER — INDOCYANINE GREEN 25 MG IV SOLR
INTRAVENOUS | Status: DC | PRN
  Administered 2020-07-21: 7.5 mg via INTRAVENOUS

## 2020-07-21 SURGICAL SUPPLY — 113 items
APPLIER CLIP 5 13 M/L LIGAMAX5 (MISCELLANEOUS) ×3
APPLIER CLIP ROT 10 11.4 M/L (STAPLE)
BAG COUNTER SPONGE SURGICOUNT (BAG) IMPLANT
BAG SURGICOUNT SPONGE COUNTING (BAG)
BLADE EXTENDED COATED 6.5IN (ELECTRODE) ×3 IMPLANT
CANNULA REDUC XI 12-8 STAPL (CANNULA) ×2
CANNULA REDUC XI 12-8MM STAPL (CANNULA) ×1
CANNULA REDUCER 12-8 DVNC XI (CANNULA) ×1 IMPLANT
CELLS DAT CNTRL 66122 CELL SVR (MISCELLANEOUS) IMPLANT
CHLORAPREP W/TINT 26 (MISCELLANEOUS) ×3 IMPLANT
CLIP APPLIE 5 13 M/L LIGAMAX5 (MISCELLANEOUS) ×1 IMPLANT
CLIP APPLIE ROT 10 11.4 M/L (STAPLE) IMPLANT
CLIP VESOLOCK LG 6/CT PURPLE (CLIP) IMPLANT
CLIP VESOLOCK MED LG 6/CT (CLIP) IMPLANT
COVER SURGICAL LIGHT HANDLE (MISCELLANEOUS) ×6 IMPLANT
COVER TIP SHEARS 8 DVNC (MISCELLANEOUS) ×1 IMPLANT
COVER TIP SHEARS 8MM DA VINCI (MISCELLANEOUS) ×3
DECANTER SPIKE VIAL GLASS SM (MISCELLANEOUS) ×3 IMPLANT
DERMABOND ADVANCED (GAUZE/BANDAGES/DRESSINGS) ×2
DERMABOND ADVANCED .7 DNX12 (GAUZE/BANDAGES/DRESSINGS) ×1 IMPLANT
DEVICE TROCAR PUNCTURE CLOSURE (ENDOMECHANICALS) IMPLANT
DRAIN CHANNEL 19F RND (DRAIN) ×3 IMPLANT
DRAPE ARM DVNC X/XI (DISPOSABLE) ×4 IMPLANT
DRAPE COLUMN DVNC XI (DISPOSABLE) ×1 IMPLANT
DRAPE DA VINCI XI ARM (DISPOSABLE) ×12
DRAPE DA VINCI XI COLUMN (DISPOSABLE) ×3
DRAPE SURG IRRIG POUCH 19X23 (DRAPES) ×3 IMPLANT
DRSG OPSITE POSTOP 4X10 (GAUZE/BANDAGES/DRESSINGS) IMPLANT
DRSG OPSITE POSTOP 4X6 (GAUZE/BANDAGES/DRESSINGS) ×3 IMPLANT
DRSG OPSITE POSTOP 4X8 (GAUZE/BANDAGES/DRESSINGS) IMPLANT
DRSG TEGADERM 2-3/8X2-3/4 SM (GAUZE/BANDAGES/DRESSINGS) ×15 IMPLANT
DRSG TEGADERM 4X4.75 (GAUZE/BANDAGES/DRESSINGS) ×3 IMPLANT
ELECT REM PT RETURN 15FT ADLT (MISCELLANEOUS) ×3 IMPLANT
ENDOLOOP SUT PDS II  0 18 (SUTURE)
ENDOLOOP SUT PDS II 0 18 (SUTURE) IMPLANT
EVACUATOR SILICONE 100CC (DRAIN) ×3 IMPLANT
GAUZE SPONGE 2X2 8PLY STRL LF (GAUZE/BANDAGES/DRESSINGS) ×1 IMPLANT
GAUZE SPONGE 4X4 12PLY STRL (GAUZE/BANDAGES/DRESSINGS) IMPLANT
GLOVE SURG ENC MOIS LTX SZ7.5 (GLOVE) ×9 IMPLANT
GLOVE SURG UNDER LTX SZ8 (GLOVE) ×9 IMPLANT
GOWN STRL REUS W/TWL XL LVL3 (GOWN DISPOSABLE) ×15 IMPLANT
GRASPER SUT TROCAR 14GX15 (MISCELLANEOUS) IMPLANT
HOLDER FOLEY CATH W/STRAP (MISCELLANEOUS) ×3 IMPLANT
KIT PROCEDURE DA VINCI SI (MISCELLANEOUS) ×3
KIT PROCEDURE DVNC SI (MISCELLANEOUS) ×1 IMPLANT
KIT TURNOVER KIT A (KITS) ×3 IMPLANT
NEEDLE INSUFFLATION 14GA 120MM (NEEDLE) ×3 IMPLANT
PACK CARDIOVASCULAR III (CUSTOM PROCEDURE TRAY) ×3 IMPLANT
PACK COLON (CUSTOM PROCEDURE TRAY) ×3 IMPLANT
PAD POSITIONING PINK XL (MISCELLANEOUS) ×3 IMPLANT
PENCIL SMOKE EVACUATOR (MISCELLANEOUS) IMPLANT
PORT LAP GEL ALEXIS MED 5-9CM (MISCELLANEOUS) ×3 IMPLANT
PROTECTOR NERVE ULNAR (MISCELLANEOUS) ×6 IMPLANT
RELOAD STAPLER 3.5X45 BLU DVNC (STAPLE) IMPLANT
RELOAD STAPLER 3.5X60 BLU DVNC (STAPLE) IMPLANT
RELOAD STAPLER 4.3X45 GRN DVNC (STAPLE) IMPLANT
RELOAD STAPLER 4.3X60 GRN DVNC (STAPLE) ×2 IMPLANT
RTRCTR WOUND ALEXIS 18CM MED (MISCELLANEOUS)
SCISSORS LAP 5X35 DISP (ENDOMECHANICALS) ×3 IMPLANT
SEAL CANN UNIV 5-8 DVNC XI (MISCELLANEOUS) ×4 IMPLANT
SEAL XI 5MM-8MM UNIVERSAL (MISCELLANEOUS) ×12
SEALER VESSEL DA VINCI XI (MISCELLANEOUS) ×3
SEALER VESSEL EXT DVNC XI (MISCELLANEOUS) ×1 IMPLANT
SET IRRIG TUBING LAPAROSCOPIC (IRRIGATION / IRRIGATOR) ×3 IMPLANT
SLEEVE ADV FIXATION 5X100MM (TROCAR) IMPLANT
SOLUTION ELECTROLUBE (MISCELLANEOUS) ×3 IMPLANT
SPONGE GAUZE 2X2 STER 10/PKG (GAUZE/BANDAGES/DRESSINGS) ×2
STAPLER 60 DA VINCI SURE FORM (STAPLE) ×3
STAPLER 60 SUREFORM DVNC (STAPLE) ×1 IMPLANT
STAPLER CANNULA SEAL DVNC XI (STAPLE) ×1 IMPLANT
STAPLER CANNULA SEAL XI (STAPLE) ×3
STAPLER ECHELON POWER CIR 29 (STAPLE) IMPLANT
STAPLER ECHELON POWER CIR 31 (STAPLE) ×3 IMPLANT
STAPLER RELOAD 3.5X45 BLU DVNC (STAPLE)
STAPLER RELOAD 3.5X45 BLUE (STAPLE)
STAPLER RELOAD 3.5X60 BLU DVNC (STAPLE)
STAPLER RELOAD 3.5X60 BLUE (STAPLE)
STAPLER RELOAD 4.3X45 GREEN (STAPLE)
STAPLER RELOAD 4.3X45 GRN DVNC (STAPLE)
STAPLER RELOAD 4.3X60 GREEN (STAPLE) ×6
STAPLER RELOAD 4.3X60 GRN DVNC (STAPLE) ×2
STAPLER SHEATH (SHEATH) ×3
STAPLER SHEATH ENDOWRIST DVNC (SHEATH) ×1 IMPLANT
STOPCOCK 4 WAY LG BORE MALE ST (IV SETS) ×6 IMPLANT
SURGILUBE 2OZ TUBE FLIPTOP (MISCELLANEOUS) ×3 IMPLANT
SUT MNCRL AB 4-0 PS2 18 (SUTURE) ×3 IMPLANT
SUT PDS AB 1 CT1 27 (SUTURE) IMPLANT
SUT PDS AB 1 TP1 96 (SUTURE) IMPLANT
SUT PROLENE 0 CT 2 (SUTURE) IMPLANT
SUT PROLENE 2 0 KS (SUTURE) ×3 IMPLANT
SUT PROLENE 2 0 SH DA (SUTURE) IMPLANT
SUT SILK 2 0 (SUTURE)
SUT SILK 2 0 SH CR/8 (SUTURE) IMPLANT
SUT SILK 2-0 18XBRD TIE 12 (SUTURE) IMPLANT
SUT SILK 3 0 (SUTURE) ×3
SUT SILK 3 0 SH CR/8 (SUTURE) ×3 IMPLANT
SUT SILK 3-0 18XBRD TIE 12 (SUTURE) ×1 IMPLANT
SUT V-LOC BARB 180 2/0GR6 GS22 (SUTURE)
SUT VIC AB 3-0 SH 18 (SUTURE) IMPLANT
SUT VIC AB 3-0 SH 27 (SUTURE)
SUT VIC AB 3-0 SH 27XBRD (SUTURE) IMPLANT
SUT VICRYL 0 UR6 27IN ABS (SUTURE) ×3 IMPLANT
SUTURE V-LC BRB 180 2/0GR6GS22 (SUTURE) IMPLANT
SYR 10ML LL (SYRINGE) ×3 IMPLANT
SYS LAPSCP GELPORT 120MM (MISCELLANEOUS)
SYSTEM LAPSCP GELPORT 120MM (MISCELLANEOUS) IMPLANT
TAPE UMBILICAL 1/8 X36 TWILL (MISCELLANEOUS) ×3 IMPLANT
TOWEL OR NON WOVEN STRL DISP B (DISPOSABLE) ×3 IMPLANT
TRAY FOLEY MTR SLVR 16FR STAT (SET/KITS/TRAYS/PACK) ×3 IMPLANT
TROCAR ADV FIXATION 5X100MM (TROCAR) ×3 IMPLANT
TUBING CONNECTING 10 (TUBING) ×4 IMPLANT
TUBING CONNECTING 10' (TUBING) ×2
TUBING INSUFFLATION 10FT LAP (TUBING) ×3 IMPLANT

## 2020-07-21 NOTE — Anesthesia Procedure Notes (Signed)

## 2020-07-21 NOTE — Discharge Instructions (Signed)
POST OP INSTRUCTIONS AFTER COLON SURGERY  DIET: Be sure to include lots of fluids daily to stay hydrated - 64oz of water per day (8, 8 oz glasses).  Avoid fast food or heavy meals for the first couple of weeks as your are more likely to get nauseated. Avoid raw/uncooked fruits or vegetables for the first 4 weeks (its ok to have these if they are blended into smoothie form). If you have fruits/vegetables, make sure they are cooked until soft enough to mash on the roof of your mouth and chew your food well. Otherwise, diet as tolerated.  Take your usually prescribed home medications unless otherwise directed.  PAIN CONTROL: Pain is best controlled by a usual combination of three different methods TOGETHER: Ice/Heat Over the counter pain medication Prescription pain medication Most patients will experience some swelling and bruising around the surgical site.  Ice packs or heating pads (30-60 minutes up to 6 times a day) will help. Some people prefer to use ice alone, heat alone, alternating between ice & heat.  Experiment to what works for you.  Swelling and bruising can take several weeks to resolve.   It is helpful to take an over-the-counter pain medication regularly for the first few weeks: Ibuprofen (Motrin/Advil) - 200mg tabs - take 3 tabs (600mg) every 6 hours as needed for pain (unless you have been directed previously to avoid NSAIDs/ibuprofen) Acetaminophen (Tylenol) - you may take 650mg every 6 hours as needed. You can take this with motrin as they act differently on the body. If you are taking a narcotic pain medication that has acetaminophen in it, do not take over the counter tylenol at the same time. NOTE: You may take both of these medications together - most patients  find it most helpful when alternating between the two (i.e. Ibuprofen at 6am, tylenol at 9am, ibuprofen at 12pm ...) A  prescription for pain medication should be given to you upon discharge.  Take your pain medication as  prescribed if your pain is not adequatly controlled with the over-the-counter pain reliefs mentioned above.  Avoid getting constipated.  Between the surgery and the pain medications, it is common to experience some constipation.  Increasing fluid intake and taking a fiber supplement (such as Metamucil, Citrucel, FiberCon, MiraLax, etc) 1-2 times a day regularly will usually help prevent this problem from occurring.  A mild laxative (prune juice, Milk of Magnesia, MiraLax, etc) should be taken according to package directions if there are no bowel movements after 48 hours.    Dressing: Your incisions are covered in Dermabond which is like sterile superglue for the skin. This will come off on it's own in a couple weeks. It is waterproof and you may bathe normally starting the day after your surgery in a shower. Avoid baths/pools/lakes/oceans until your wounds have fully healed.  ACTIVITIES as tolerated:   Avoid heavy lifting (>10lbs or 1 gallon of milk) for the next 6 weeks. You may resume regular daily activities as tolerated--such as daily self-care, walking, climbing stairs--gradually increasing activities as tolerated.  If you can walk 30 minutes without difficulty, it is safe to try more intense activity such as jogging, treadmill, bicycling, low-impact aerobics.  DO NOT PUSH THROUGH PAIN.  Let pain be your guide: If it hurts to do something, don't do it. You may drive when you are no longer taking prescription pain medication, you can comfortably wear a seatbelt, and you can safely maneuver your car and apply brakes.  FOLLOW UP in our   office Please call [facility] at [phone] to set up an appointment to see your surgeon in the office for a follow-up appointment approximately 2 weeks after your surgery. Make sure that you call for this appointment the day you arrive home to insure a convenient appointment time.  9. If you have disability or family leave forms that need to be completed, you may have  them completed by your primary care physician's office; for return to work instructions, please ask our office staff and they will be happy to assist you in obtaining this documentation   When to call us [phone]: Poor pain control Reactions / problems with new medications (rash/itching, etc)  Fever over 101.5 F (38.5 C) Inability to urinate Nausea/vomiting Worsening swelling or bruising Continued bleeding from incision. Increased pain, redness, or drainage from the incision  The clinic staff is available to answer your questions during regular business hours (8:30am-5pm).  Please don't hesitate to call and ask to speak to one of our nurses for clinical concerns.   A surgeon from [facility] is always on call at the hospitals   If you have a medical emergency, go to the nearest emergency room or call 911.  [facility] [address], [address], [place],   [address] MAIN: [phone] FAX: [phone] [email or web address]  

## 2020-07-21 NOTE — H&P (Signed)
CC: Here today for surgery   HPI: Johnny Weiss is a very pleasant 60yoM who underwent his first colonoscopy for screening purposes with Dr. Paulita Weiss 04/14/20.  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  This was tattooed.  He was subsequently referred to see [place]. He has denied complaints or any symptoms related to this.  He denies any history of rectal bleeding, blood in his stool, appetite or weight changes.  He also denies any history of groin pain since I saw him in 2019.  He reports a stable reducible bulge on the left side.   Denies any changes in his health or health history since we met in the preprocedure for his endoscopy. States he is ready for surgery.  Flex sig showed this to be in the distal most sigmoid colon as opposed to 'rectum' proper   PMH: Denies   PSH: Wisdom teeth surgery   FHx: Denies FHx of malignancy   Social: Denies use of tobacco/drugs; social EtOH use. He works as an Copy.   ROS: A comprehensive 10 system review of systems was completed with the patient and pertinent findings as noted above.  Past Medical History:  Diagnosis Date   Asthma    Heart murmur     Past Surgical History:  Procedure Laterality Date   BIOPSY  06/08/2020   Procedure: BIOPSY;  Surgeon: Ileana Roup, MD;  Location: [facility];  Service: General;;   FLEXIBLE SIGMOIDOSCOPY N/A 06/08/2020   Procedure: FLEXIBLE SIGMOIDOSCOPY BIOPSY DIAGNOSTIC;  Surgeon: Ileana Roup, MD;  Location: [facility];  Service: General;  Laterality: N/A;    History reviewed. No pertinent family history.  Social:  reports that he has never smoked. He has never used smokeless tobacco. He reports current alcohol use. He reports that he does not use drugs.  Allergies: No Known Allergies  Medications: I  have reviewed the patient's current medications.  Results for orders placed or performed during the hospital encounter of 07/21/20 (from the past 48 hour(s))  ABO/Rh     Status: None   Collection Time: 07/21/20  5:49 AM  Result Value Ref Range   ABO/RH(D)      B POS Performed at [facility], [place] [address], [place], [place] [address]     No results found.  ROS - all of the below systems have been reviewed with the patient and positives are indicated with bold text General: chills, fever or night sweats Eyes: blurry vision or double vision ENT: epistaxis or sore throat Allergy/Immunology: itchy/watery eyes or nasal congestion Hematologic/Lymphatic: bleeding problems, blood clots or swollen lymph nodes Endocrine: temperature intolerance or unexpected weight changes Breast: new or changing breast lumps or nipple discharge Resp: cough, shortness of breath, or wheezing CV: chest pain or dyspnea on exertion GI: as per HPI GU: dysuria, trouble voiding, or hematuria MSK: joint pain or joint stiffness Neuro: TIA or stroke symptoms Derm: pruritus and skin lesion changes Psych: anxiety and depression  PE Blood pressure 139/84, pulse 68, temperature 98.5 F (36.9 C), temperature source Oral, resp. rate 18, height 6' (1.829 m), weight 77.1 kg, SpO2 100 %. Constitutional: NAD; conversant Eyes: Moist conjunctiva; no lid lag; anicteric Neck: Trachea midline; no thyromegaly Lungs: Normal respiratory effort; no tactile fremitus CV: RRR; no palpable thrills; no pitting edema GI: Abd soft,  NT/ND; no palpable hepatosplenomegaly MSK: Normal range of motion of extremities;  Psychiatric: Appropriate affect; alert and oriented x3  Results for orders placed or performed during the hospital encounter of 07/21/20 (from the past 48 hour(s))  ABO/Rh     Status: None   Collection Time: 07/21/20  5:49 AM  Result Value Ref Range   ABO/RH(D)      B POS Performed at [facility], [place] [address], [place], [place] [address]     No results found.   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 The pathophysiology of colon polyps and cancer was discussed with associated pictures. -We have again reviewed robotic-assisted low anterior resection, flexible sigmoidoscopy -The planned procedure, material risks (including, but not limited to, pain, bleeding, infection, scarring, need for blood transfusion, damage to surrounding structures- blood vessels/nerves/viscus/organs, damage to ureter, urine leak, leak from anastomosis, need for additional procedures, need for stoma which may be permanent, worsening of pre-existing medical conditions, hernia, recurrence, pneumonia, heart attack, stroke, death) benefits and alternatives to surgery were discussed at length. The patient's questions were answered to their satisfaction, they voiced understanding and elected to proceed with surgery. Additionally, we discussed typical postoperative expectations and the recovery process.  [name], MD [facility] Use [email or web address] to contact on call provider

## 2020-07-21 NOTE — Transfer of Care (Signed)
Immediate Anesthesia Transfer of Care Note  Patient: Johnny Weiss  Procedure(s) Performed: XI ROBOTIC ASSISTED LOWER ANTERIOR RESECTION, INTRAOPERATIVE ASSESSMENT OF PERFUSION FLEXIBLE SIGMOIDOSCOPY  Patient Location: PACU  Anesthesia Type:General  Level of Consciousness: awake, alert , oriented and patient cooperative  Airway & Oxygen Therapy: Patient Spontanous Breathing and Patient connected to face mask oxygen  Post-op Assessment: Report given to RN, Post -op Vital signs reviewed and stable and Patient moving all extremities  Post vital signs: Reviewed and stable  Last Vitals:  Vitals Value Taken Time  BP 147/98 07/21/20 1114  Temp    Pulse 64 07/21/20 1116  Resp 14 07/21/20 1116  SpO2 100 % 07/21/20 1116  Vitals shown include unvalidated device data.  Last Pain:  Vitals:   07/21/20 0550  TempSrc:   PainSc: 0-No pain      Patients Stated Pain Goal: 4 (91/63/84 6659)  Complications: No notable events documented.

## 2020-07-21 NOTE — Anesthesia Postprocedure Evaluation (Signed)
Anesthesia Post Note  Patient: Johnny Weiss  Procedure(s) Performed: XI ROBOTIC ASSISTED LOWER ANTERIOR RESECTION, INTRAOPERATIVE ASSESSMENT OF PERFUSION FLEXIBLE SIGMOIDOSCOPY     Patient location during evaluation: PACU Anesthesia Type: General Level of consciousness: awake and alert Pain management: pain level controlled Vital Signs Assessment: post-procedure vital signs reviewed and stable Respiratory status: spontaneous breathing, nonlabored ventilation and respiratory function stable Cardiovascular status: blood pressure returned to baseline and stable Postop Assessment: no apparent nausea or vomiting Anesthetic complications: no   No notable events documented.  Last Vitals:  Vitals:   07/21/20 1313 07/21/20 1416  BP: 129/84 (!) 151/104  Pulse: (!) 51 66  Resp: 18 18  Temp: (!) 36.3 C   SpO2: 100% 98%    Last Pain:  Vitals:   07/21/20 1313  TempSrc: Oral  PainSc:                  Tylen Leverich,W. EDMOND

## 2020-07-21 NOTE — Op Note (Addendum)
PATIENT: Johnny Weiss  50 y.o. male  Patient Care Team: Jonathon Jordan, MD as PCP - General (Family Medicine)  PREOP DIAGNOSIS: Rectosigmoid mass  POSTOP DIAGNOSIS: Same  PROCEDURE: Robotic assisted low anterior resection with colorectal anastomosis Intraoperative assessment of perfusion Flexible sigmoidoscopy  SURGEON: [name], MD  ASSISTANT: Michael Boston, MD  ANESTHESIA: General endotracheal  EBL: 20 mL Total I/O In: 1100 [I.V.:1000; IV Piggyback:100] Out: 120 [Urine:100; Blood:20]  SPECIMEN: Rectosigmoid colon, open end proximal Distal anastomotic donut  COUNTS: Sponge, needle and instrument counts were reported correct x2  FINDINGS:  Tattoo visualized just above peritoneal reflection. Mass just proximal to the tattoo. Low anterior resection then carried out to ensure adequate distal margin, grossly 5 cm beyond the mass. A 31 mm EEA colorectal anastomosis fashioned 10 cm from the anal verge by flexible sigmoidoscopy.  NARRATIVE: Informed consent was verified. He was taken to the operating room, placed supine on the operating table and SCD's were applied. General endotracheal anesthesia was induced without difficulty. The patient was then positioned in the lithotomy position with Allen stirrups.  All pressure points were then evaluated and padded. A foley catheter was then placed by nursing under sterile conditions. Hair on the abdomen was clipped.  The abdomen was then prepped and draped in the standard sterile fashion. Surgical timeout was called indicating the correct patient, procedure, positioning and need for preoperative antibiotics.   An OG tube was placed by anesthesia and confirmed to be to suction.  At Palmer's point, a stab incision was created and the Veress needle was introduced into the peritoneal cavity on the first attempt.  Intraperitoneal location was confirmed by the aspiration and saline drop test.  Pneumoperitoneum was established to a maximum  pressure of 15 mmHg using CO2.  Following this, the abdomen was marked for planned trocar sites.  Just to the right and cephalad to the umbilicus, an 8 mm incision was created and an 8 mm blunt tipped robotic trocar was cautiously placed into the peritoneal cavity.  The laparoscope was inserted and demonstrated no evidence of trocar site nor Veress needle site complications.  The Veress needle was removed.  Bilateral transversus abdominis plane blocks were then created using a dilute mixture of Exparel with Marcaine.  3 additional 8 mm robotic trochars were placed under direct visualization roughly in a line extending from the right ASIS towards the left upper quadrant. The bladder was inspected and noted to be at/below the pubic symphysis.  Staying 3 fingerbreadths above the pubic symphysis, an incision was created and the 12 mm robotic trocar inserted directed cephalad into the peritoneal cavity under direct visualization.  An additional 5 mm assist port was placed in the right lateral abdomen under direct visualization.  The abdomen was surveyed and there was evident tattoo entering of the pelvis just above the peritoneal reflection.  There are no other abnormalities apparent.  The peritoneal surfaces are smooth and glistening.   He was positioned in Trendelenburg with some left side up.  Small bowel was carefully retracted out of the pelvis.  The robot was then docked and I went to the console.   The sigmoid colon was readily identified.  Attachments of the sigmoid colon were taken down from the intersigmoid fossa.  The rectosigmoid colon was grasped and elevated anteriorly.  Beginning with a medial to lateral approach, the peritoneum overlying the presacral space was carefully incised.  The TME plane was readily gained working in a plane between the fascia propria of  the rectum and the presacral fascia.  Hypogastric nerves were seen going along the the presacral fascia and were protected free of injury.   Working more proximally, the mesorectum and sigmoid mesentery were carefully mobilized off of the peritoneum.  The left ureter was identified and protected free of injury.  The left gonadal vessels were identified and protected.  These were both swept "down."  The superior hemorrhoidal and IMA pedicles were identified. Further mesocolon was mobilized proximally staying in this plane between the retroperitoneum proper and the mesocolon. Attention was then turned to the lateral portion of dissection.  The sigmoid colon was retracted to the right.  The sigmoid colon was fully mobilized and working proximal to the diseased segment of colon, the descending colon was mobilized by incising the Tyashia Morrisette line of Toldt.  This was done all the way up to the level of the splenic flexure.  The associated mesocolon was also mobilized medially.  He has a large redundant sigmoid colon. The left ureter again was confirmed to be well away for plan dissection and well away from the vasculature which had been dissected medially.  The rectosigmoid colon was elevated anteriorly. The left ureter was re-identified. The IMA was clear of this. The IMA was then divided with the vessel sealer. The stump was inspected and noted to be completely hemostatic with a good seal.  The mesentery was divided out to the point of planned proximal division.  Working more distally, the rectum was identified.  The tattoo was just above the peritoneal reflection.  The colon is clamped proximal to all of this and I went below to perform a flexible sigmoidoscopy.  We were able to confirm that the tattoo was extending well distal to the mass as well.  The TME plane was further developed into the pelvis mobilizing the posterior approach first.  The fascia propria of the rectum was carefully dissected away from the presacral fascia.  The dissection was then developed bilaterally along the lateral approaches.  Finally, the anterior TME plane was developed.  We were  well distal to the mass lesion with at least a 5 cm gross margin.  The mesentery out to this level was then cleared using the vessel sealer, performing a tumor-specific total mesorectal excision. A 60 mm green load robotic stapler was then placed through the 12 mm port and introduced into the peritoneal cavity.  The rectum was divided with 2 firings of the stapler as he has a generous caliber rectum.  The stump was intact and healthy in appearance.   Attention was turned to performing a perfusion test. ICG was administered by anesthesia and at the level of the cleared mesentery proximally, there was excellent uptake of the tracer.  The rectum was also well perfused in appearance.  There was a visible pulse in the mesentery out to the level of the cleared colon at the level of the proximal sigmoid/descending colon junction.  This colon is also supple and healthy in appearance without any thickening.  The point of demarcation was marked using her vessel sealer. The planned point of transection reached into the pelvis without any difficulty and remained in that location without any tension. A locking grasper was then placed on the staple line.  The pelvis was inspected and noted to be hemostatic.   Attention was turned to the extracorporeal portion of the procedure.  The robot was undocked.  I scrubbed back in.  Using the 12 mm trocar site, a Pfannenstiel incision was created  and incorporated the fascial opening through the 12 mm port site.  The rectus fascia was incised and then elevated.  The rectus muscle was mobilized free of the overlying fascia.  The peritoneum was incised in the midline well above the location of the bladder.  An Hidalgo wound protector was placed.  Towels were placed around the field.  The divided colon was passed through the wound protector.  The point of proximal division was identified and was again on a healthy segment of supple colon with a palpable pulse in the mesentery and just  proximal to the area we had marked previously. This was pink in color.  A pursestring device was applied.  A 2-0 Prolene on a Keith needle was passed.  The colon was divided and passed off with the open end being proximal.  EEA sizers were then introduced and a 31 mm EEA selected.  "Belt loops" consisting of 3-0 silk were placed around the pursestring suture line.  The anvil was placed and the pursestring tied.  A small amount of fat was cleared from the planned anastomosis and no diverticula were apparent within this.  This was placed back into the abdomen and a cap placed over the wound protector port site.  Pneumoperitoneum was reestablished.  I then went below to pass the stapler.  My partner remained above.  EEA sizers were cautiously passed trans-anally under direct visualization.  The stapler was passed and the spike deployed just anterior to the staple line.  The components were then mated.  Orientation was confirmed such that there is no twisting of the colon nor small bowel underneath the mesenteric defect. Care was taken to ensure no other structures were incorporated within this either.  The stapler was then closed, held, and fired. This was then removed. The donuts were inspected and noted to be complete. The distal donut is passed off as specimen.  The colon proximal to the anastomosis was then gently occluded. The pelvis was filled with sterile irrigation. Under direct visualization, I passed a flexible sigmoidoscope.  The anastomosis was under water.  With good distention of the anastomosis there was no air leak. The anastomosis pink in appearance.  This is located at 10 cm from the anal verge by flexible sigmoidoscopy.  It is hemostatic.  Additionally, looking from above, there is no tension on the colon or mesentery.  Sigmoidoscope was withdrawn.  Irrigation was evacuated from the pelvis.  The abdomen and pelvis are surveyed and noted to be completely hemostatic without any apparent injury.  Under  direct visualization, all trochars are removed.  The Carrick wound protector was removed.  Gowns/gloves are changed and a fresh set of clean instruments utilized. Additional sterile drapes were placed around the field.   Sponge, needle, and instrument counts were reported correct.  The Pfannenstiel peritoneum was closed with a running 2-0 Vicryl suture.  The rectus fascia was then closed using 2 running #1 PDS sutures.  The fascia was then palpated and noted to be completely closed.  Additional anesthetic was infiltrated at the Pfannenstiel site.  Sponge, needle, and instrument counts were then again reported correct. 4-0 Monocryl subcuticular suture was used to close the skin of all incision sites.  Dermabond was placed over all incisions.  A honeycomb dressing placed over the Pfannenstiel as well.   He is then taken out of lithotomy, awakened from anesthesia, extubated, and transferred to a stretcher for transport to PACU in satisfactory condition having tolerated the procedure well.

## 2020-07-22 ENCOUNTER — Encounter (HOSPITAL_COMMUNITY): Payer: Self-pay | Admitting: Surgery

## 2020-07-22 LAB — BASIC METABOLIC PANEL
Anion gap: 5 (ref 5–15)
BUN: 9 mg/dL (ref 6–20)
CO2: 28 mmol/L (ref 22–32)
Calcium: 8.7 mg/dL — ABNORMAL LOW (ref 8.9–10.3)
Chloride: 106 mmol/L (ref 98–111)
Creatinine, Ser: 0.82 mg/dL (ref 0.61–1.24)
GFR, Estimated: >60 mL/min (ref >60)
Glucose, Bld: 120 mg/dL — ABNORMAL HIGH (ref 70–99)
Potassium: 4 mmol/L (ref 3.5–5.1)
Sodium: 139 mmol/L (ref 135–145)

## 2020-07-22 LAB — CBC
HCT: 39.3 % (ref 39.0–52.0)
Hemoglobin: 13.2 g/dL (ref 13.0–17.0)
MCH: 30.8 pg (ref 26.0–34.0)
MCHC: 33.6 g/dL (ref 30.0–36.0)
MCV: 91.8 fL (ref 80.0–100.0)
Platelets: 301 10*3/uL (ref 150–400)
RBC: 4.28 MIL/uL (ref 4.22–5.81)
RDW: 12.8 % (ref 11.5–15.5)
WBC: 11.3 10*3/uL — ABNORMAL HIGH (ref 4.0–10.5)
nRBC: 0 % (ref 0.0–0.2)

## 2020-07-22 LAB — SURGICAL PATHOLOGY

## 2020-07-22 NOTE — Progress Notes (Signed)
Subjective No acute events. Feeling well. Up in chair this morning. Tolerating liquids without nausea or vomiting. Abdominal wall soreness but no pain. Passing flatus. No BM yet.  Objective: Vital signs in last 24 hours: Temp:  [97.4 F (36.3 C)-98.6 F (37 C)] 97.7 F (36.5 C) (07/01 0943) Pulse Rate:  [51-91] 71 (07/01 0943) Resp:  [13-18] 17 (07/01 0943) BP: (121-153)/(79-104) 145/79 (07/01 0943) SpO2:  [96 %-100 %] 100 % (07/01 0943) Last BM Date: 07/22/20  Intake/Output from previous day: 06/30 0701 - 07/01 0700 In: 2320 [P.O.:480; I.V.:1740; IV Piggyback:100] Out: 2970 [Urine:2950; Blood:20] Intake/Output this shift: Total I/O In: 237 [P.O.:237] Out: 50 [Urine:50]  Gen: NAD, comfortable CV: RRR Pulm: Normal work of breathing Abd: Soft, minimal tenderness, nondistended. Incisions c/d/I without erythema or drainage. Ext: SCDs in place  Lab Results: CBC  Recent Labs    07/22/20 0338  WBC 11.3*  HGB 13.2  HCT 39.3  PLT 301   BMET Recent Labs    07/22/20 0338  NA 139  K 4.0  CL 106  CO2 28  GLUCOSE 120*  BUN 9  CREATININE 0.82  CALCIUM 8.7*   PT/INR No results for input(s): LABPROT, INR in the last 72 hours. ABG No results for input(s): PHART, HCO3 in the last 72 hours.  Invalid input(s): PCO2, PO2  Studies/Results:  Anti-infectives: Anti-infectives (From admission, onward)    Start     Dose/Rate Route Frequency Ordered Stop   07/21/20 1400  neomycin (MYCIFRADIN) tablet 1,000 mg  Status:  Discontinued       See Hyperspace for full Linked Orders Report.   1,000 mg Oral 3 times per day 07/21/20 0520 07/21/20 0526   07/21/20 1400  metroNIDAZOLE (FLAGYL) tablet 1,000 mg  Status:  Discontinued       See Hyperspace for full Linked Orders Report.   1,000 mg Oral 3 times per day 07/21/20 0520 07/21/20 0526   07/21/20 0600  cefoTEtan (CEFOTAN) 2 g in sodium chloride 0.9 % 100 mL IVPB        2 g 200 mL/hr over 30 Minutes Intravenous On call to O.R.  07/20/20 0742 07/21/20 0812        Assessment/Plan: Patient Active Problem List   Diagnosis Date Noted   Colonic mass 07/21/2020   s/p Procedure(s): XI ROBOTIC ASSISTED LOWER ANTERIOR RESECTION, INTRAOPERATIVE ASSESSMENT OF PERFUSION FLEXIBLE SIGMOIDOSCOPY 07/21/2020  -He is doing great -We reviewed his procedure, findings and plans moving forward and answered his questions -D/C IVF -D/C Foley -Ambulate 5x/day -Liquids, advancing to soft diet as tolerated -Continue Entereg today -Ppx: SQH, SCDs   LOS: 1 day   [name], MD [facility] Use [email or web address] to contact on call provider

## 2020-07-22 NOTE — Plan of Care (Signed)
  Problem: Health Behavior/Discharge Planning: Goal: Ability to manage health-related needs will improve Outcome: Progressing   Problem: Clinical Measurements: Goal: Will remain free from infection Outcome: Progressing   Problem: Clinical Measurements: Goal: Respiratory complications will improve Outcome: Progressing   

## 2020-07-23 ENCOUNTER — Other Ambulatory Visit (HOSPITAL_COMMUNITY): Payer: Self-pay

## 2020-07-23 LAB — BASIC METABOLIC PANEL
Anion gap: 8 (ref 5–15)
BUN: 14 mg/dL (ref 6–20)
CO2: 27 mmol/L (ref 22–32)
Calcium: 8.6 mg/dL — ABNORMAL LOW (ref 8.9–10.3)
Chloride: 103 mmol/L (ref 98–111)
Creatinine, Ser: 0.97 mg/dL (ref 0.61–1.24)
GFR, Estimated: >60 mL/min (ref >60)
Glucose, Bld: 93 mg/dL (ref 70–99)
Potassium: 3.8 mmol/L (ref 3.5–5.1)
Sodium: 138 mmol/L (ref 135–145)

## 2020-07-23 LAB — CBC
HCT: 37.5 % — ABNORMAL LOW (ref 39.0–52.0)
Hemoglobin: 12.3 g/dL — ABNORMAL LOW (ref 13.0–17.0)
MCH: 30.4 pg (ref 26.0–34.0)
MCHC: 32.8 g/dL (ref 30.0–36.0)
MCV: 92.8 fL (ref 80.0–100.0)
Platelets: 238 10*3/uL (ref 150–400)
RBC: 4.04 MIL/uL — ABNORMAL LOW (ref 4.22–5.81)
RDW: 13 % (ref 11.5–15.5)
WBC: 8.3 10*3/uL (ref 4.0–10.5)
nRBC: 0 % (ref 0.0–0.2)

## 2020-07-23 MED ORDER — TRAMADOL HCL 50 MG PO TABS
50.0000 mg | ORAL_TABLET | Freq: Four times a day (QID) | ORAL | 0 refills | Status: AC | PRN
  Filled 2020-07-23: qty 15, 4d supply, fill #0

## 2020-07-23 NOTE — TOC Transition Note (Signed)
Transition of Care [facility]) - CM/SW Discharge Note   Patient Details  Name: Johnny Weiss MRN: [id number] Date of Birth: 19-Jul-1970  [facility] CM/SW Contact:  Leeroy Cha, RN Phone Number: 07/23/2020, 11:12 AM   Clinical Narrative:    Discharged home with out any toc needs present.         Patient Goals and CMS Choice        Discharge Placement                       Discharge Plan and Services                                     Social Determinants of Health (SDOH) Interventions     Readmission Risk Interventions No flowsheet data found.

## 2020-07-23 NOTE — Discharge Summary (Signed)
Patient ID: Johnny Weiss MRN: [id number] DOB/AGE: 1970/04/10 50 y.o.  Admit date: 07/21/2020 Discharge date: 07/23/2020  Discharge Diagnoses Patient Active Problem List   Diagnosis Date Noted   Colonic mass 07/21/2020    Consultants None  Procedures OR 07/21/2020: PROCEDURE: Robotic assisted low anterior resection with colorectal anastomosis Intraoperative assessment of perfusion Flexible sigmoidoscopy  Hospital Course: He was admitted postoperatively where he recovered quite well. On POD#2, he is tolerating a diet without nausea/vomiting, having reliable bowel function, ambulating well on his own and pain well controlled with tylenol/ibuprofen. He is voiding spontaneously on his own. He is comfortable with and stable for discharge home today    Allergies as of 07/23/2020   No Known Allergies      Medication List     TAKE these medications    cetirizine 10 MG tablet Commonly known as: ZYRTEC Take 10 mg by mouth daily as needed for allergies.   multivitamin with minerals Tabs tablet Take 1 tablet by mouth daily.   traMADol 50 MG tablet Commonly known as: Ultram Take 1 tablet (50 mg total) by mouth every 6 (six) hours as needed for up to 5 days (postop pain not controlled with tylenol and ibuprofen first).          Follow-up Information     Ileana Roup, MD Follow up in 2 week(s).   Specialties: General Surgery, Colon and Rectal Surgery Contact information: [place] [address] [address] Dema Severin, M.D. [facility]

## 2020-07-23 NOTE — Progress Notes (Signed)
Subjective Doing great, no complaints. Tolerating diet without n/v. Having flatus and multiple Bms now. No significant abdominal pain, just soreness. No distention.  Objective: Vital signs in last 24 hours: Temp:  [98 F (36.7 C)-98.2 F (36.8 C)] 98.1 F (36.7 C) (07/02 0617) Pulse Rate:  [60-66] 66 (07/02 0617) Resp:  [17-18] 18 (07/02 0617) BP: (135-151)/(81-92) 135/81 (07/02 0617) SpO2:  [97 %-100 %] 97 % (07/02 0617) Weight:  [80 kg] 80 kg (07/02 0500) Last BM Date: 07/22/20  Intake/Output from previous day: 07/01 0701 - 07/02 0700 In: 714 [P.O.:714] Out: 1100 [Urine:1100] Intake/Output this shift: Total I/O In: -  Out: 275 [Urine:275]  Gen: NAD, comfortable CV: RRR Pulm: Normal work of breathing Abd: Soft, nontender, nondistended. Incisions c/d/I without erythema or drainage. Ext: SCDs in place  Lab Results: CBC  Recent Labs    07/22/20 0338 07/23/20 0434  WBC 11.3* 8.3  HGB 13.2 12.3*  HCT 39.3 37.5*  PLT 301 238   BMET Recent Labs    07/22/20 0338 07/23/20 0434  NA 139 138  K 4.0 3.8  CL 106 103  CO2 28 27  GLUCOSE 120* 93  BUN 9 14  CREATININE 0.82 0.97  CALCIUM 8.7* 8.6*   PT/INR No results for input(s): LABPROT, INR in the last 72 hours. ABG No results for input(s): PHART, HCO3 in the last 72 hours.  Invalid input(s): PCO2, PO2  Studies/Results:  Anti-infectives: Anti-infectives (From admission, onward)    Start     Dose/Rate Route Frequency Ordered Stop   07/21/20 1400  neomycin (MYCIFRADIN) tablet 1,000 mg  Status:  Discontinued       See Hyperspace for full Linked Orders Report.   1,000 mg Oral 3 times per day 07/21/20 0520 07/21/20 0526   07/21/20 1400  metroNIDAZOLE (FLAGYL) tablet 1,000 mg  Status:  Discontinued       See Hyperspace for full Linked Orders Report.   1,000 mg Oral 3 times per day 07/21/20 0520 07/21/20 0526   07/21/20 0600  cefoTEtan (CEFOTAN) 2 g in sodium chloride 0.9 % 100 mL IVPB        2 g 200 mL/hr over  30 Minutes Intravenous On call to O.R. 07/20/20 0742 07/21/20 0812        Assessment/Plan: Patient Active Problem List   Diagnosis Date Noted   Colonic mass 07/21/2020   s/p Procedure(s): XI ROBOTIC ASSISTED LOWER ANTERIOR RESECTION, INTRAOPERATIVE ASSESSMENT OF PERFUSION FLEXIBLE SIGMOIDOSCOPY 07/21/2020  -He continues to do great. 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 We reviewed this today -He has follow-up arranged with me -Discharge home today -Ppx: SQH, SCDs   LOS: 2 days   [name], MD [facility] Use [email or web address] to contact on call provider

## 2020-07-23 NOTE — Progress Notes (Signed)
Reviewed written dc instructions w pt and all questions answered. Pt verbalized understanding. D/c via w/c w all belongings in stable condition.

## 2020-07-26 ENCOUNTER — Other Ambulatory Visit (HOSPITAL_COMMUNITY): Payer: Self-pay

## 2020-09-01 IMAGING — CT CT NECK WITHOUT CONTRAST
3 of 4 series · 14 of 33 positions shown, 17 images · non-contrast
Comparison: Prior radiograph from earlier same day.

CLINICAL DATA: Initial evaluation for possible foreign body,
swallowed fish bone.

EXAM:
CT NECK WITHOUT CONTRAST
TECHNIQUE: Multidetector CT imaging of the neck was performed following the
standard protocol without intravenous contrast.

[Series 4: sag neck · sagittal · 0.39mm/px · 5 of 101 slices shown, 6 images]
[im 34/101  bone]
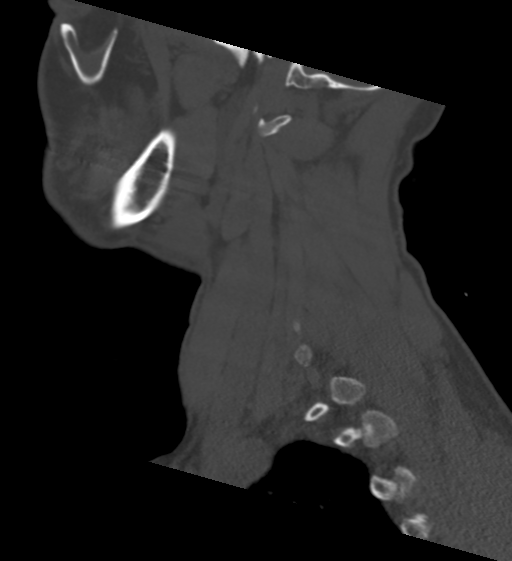
[im 42/101  bone]
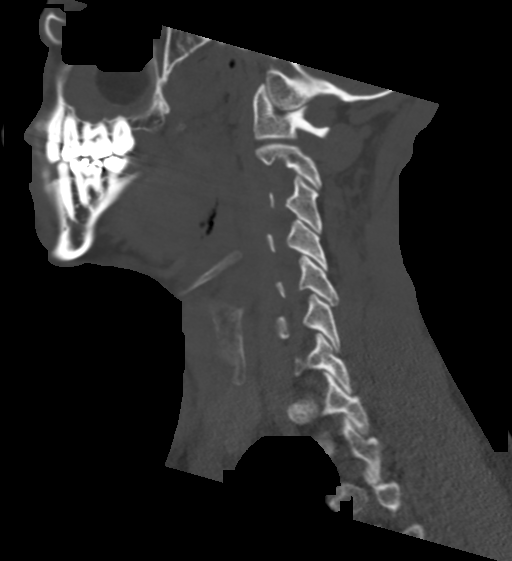
[im 51/101  soft-tissue]
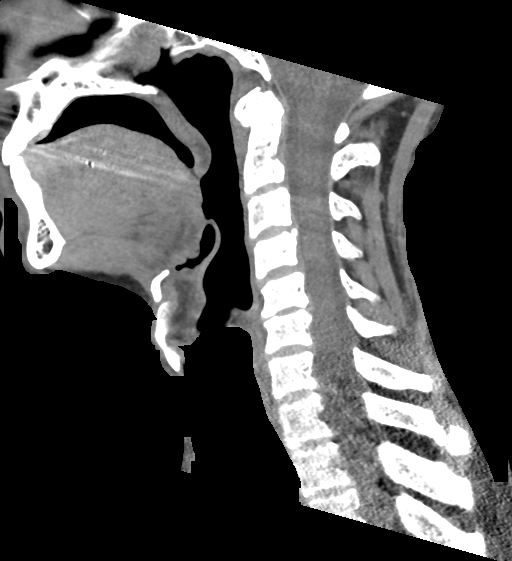
[im 51/101  bone]
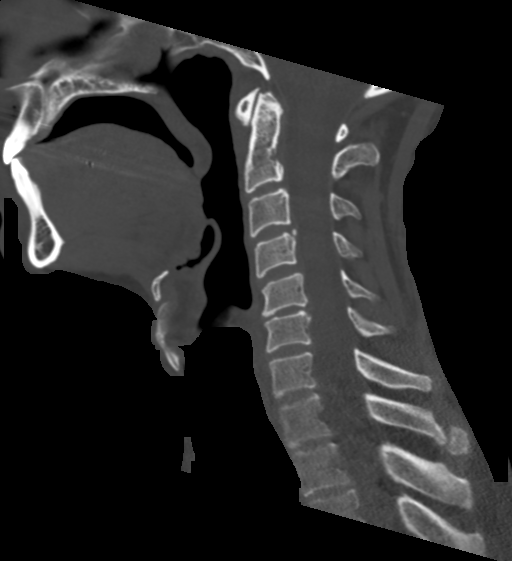
[im 59/101  bone]
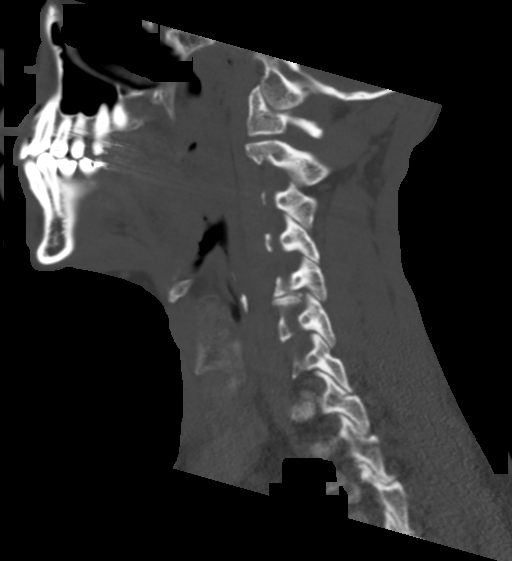
[im 67/101  bone]
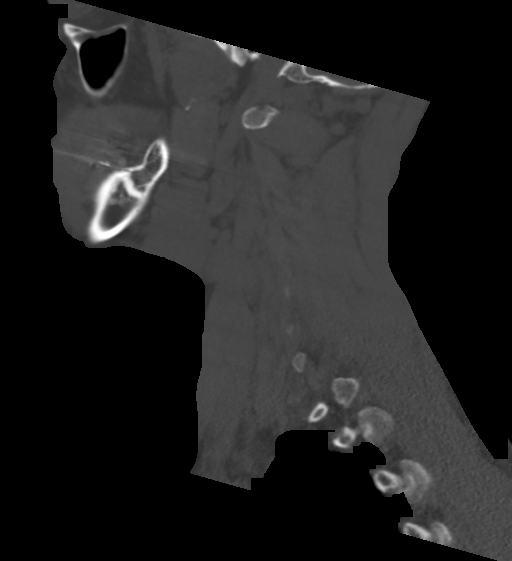

[Series 5: orthogonal ax · axial · 0.39mm/px · z∈[+1043,+1193]mm · 6 of 110 slices shown, 8 images]
[im 16/110  soft-tissue]
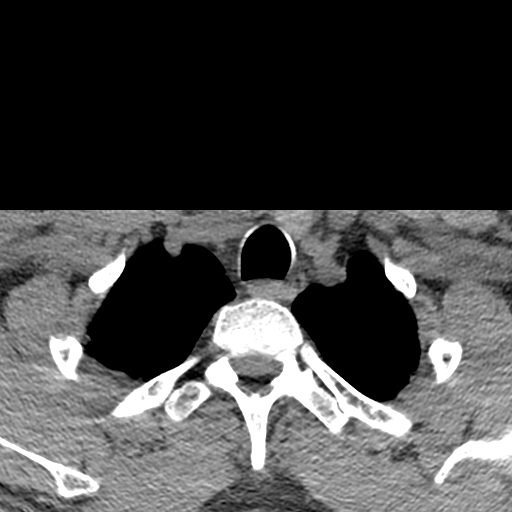
[im 16/110  bone]
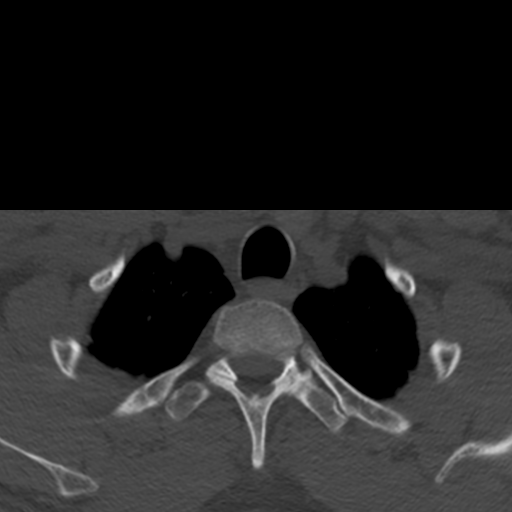
[im 32/110  bone]
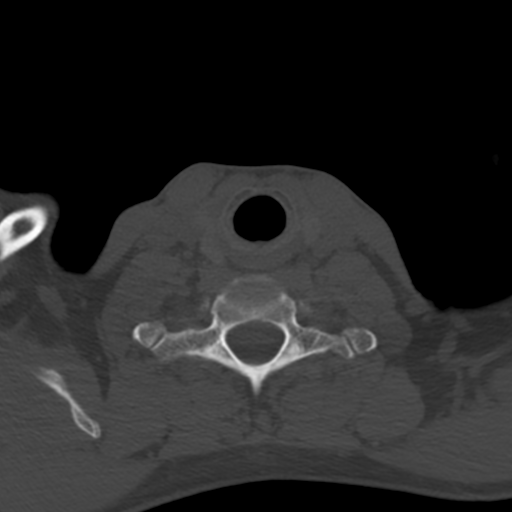
[im 47/110  bone]
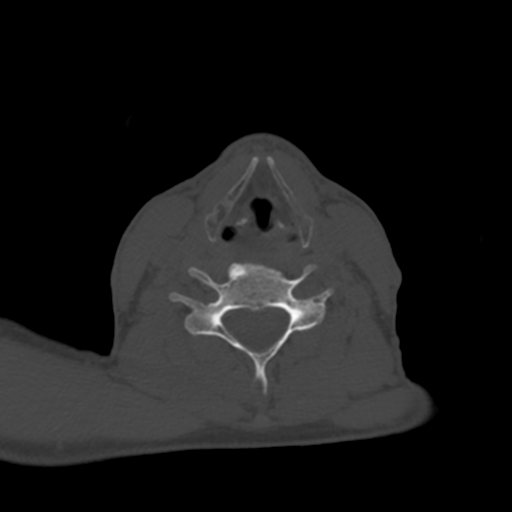
[im 63/110  bone]
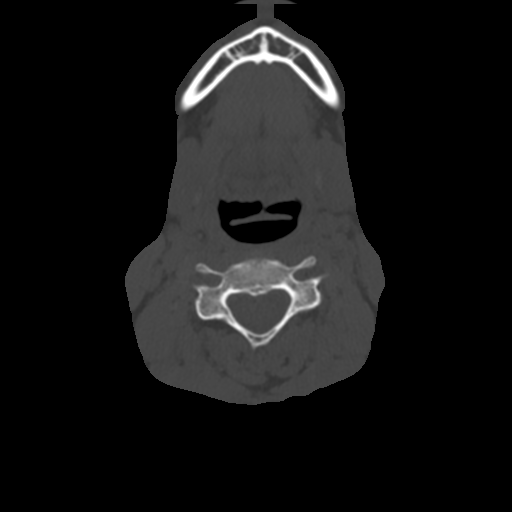
[im 78/110  soft-tissue]
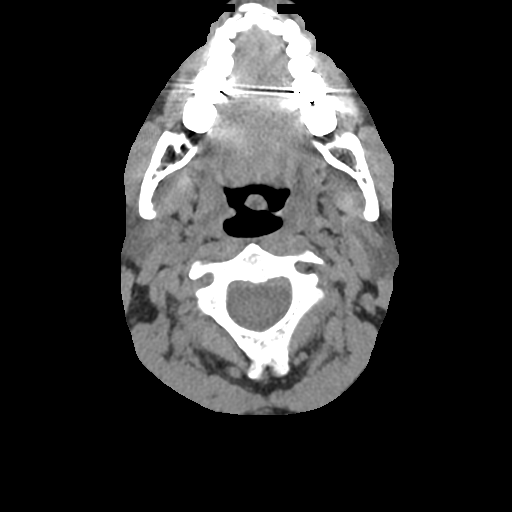
[im 78/110  bone]
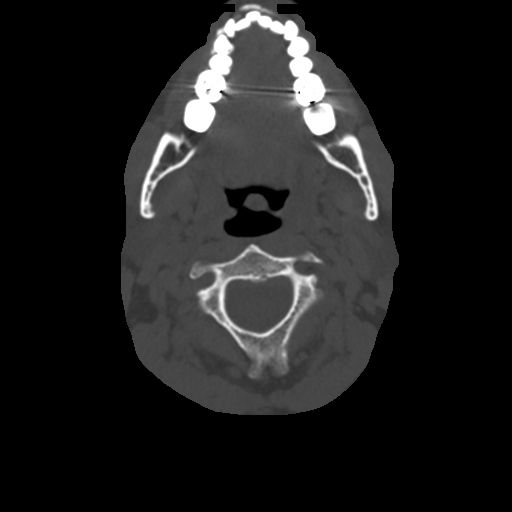
[im 94/110  bone]
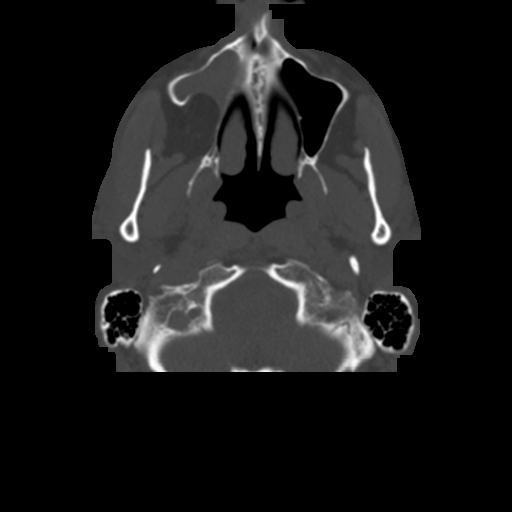

[Series 6: cor neck · coronal · 0.39mm/px · 3 of 101 slices shown]
[im 24/101  bone]
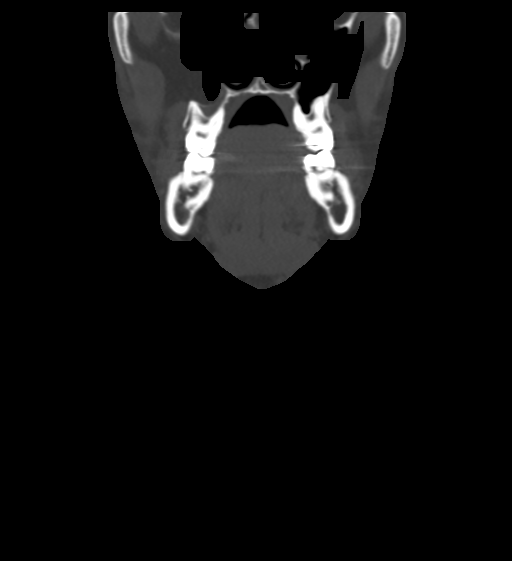
[im 42/101  bone]
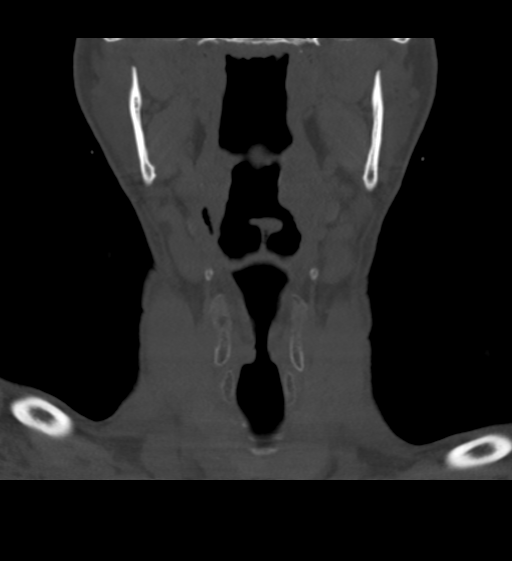
[im 60/101  bone]
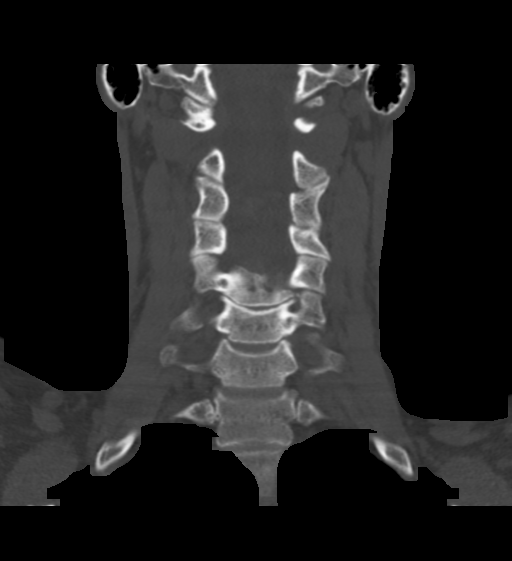

[14 of 33 positions shown; findings below may reference images not displayed]

FINDINGS: Pharynx and larynx: Oral cavity within normal limits without
discrete mass or collection. No acute abnormality about the
dentition. Palatine tonsils symmetric and within normal limits.
Parapharyngeal fat preserved. Nasopharynx and oropharynx within
normal limits. Epiglottis normal. Vallecula clear. No
retropharyngeal collection. Remainder of the hypopharynx and
supraglottic larynx within normal limits. Piriform sinuses are
clear. Glottis within normal limits. Visualized subglottic airway
clear. Visualized upper esophagus within normal limits. No
radiopaque foreign body identified.

Salivary glands: Salivary glands including the parotid and
submandibular glands are normal.

Thyroid: Visualized thyroid is normal.

Lymph nodes: No adenopathy within the neck.

Vascular: Grossly negative, although not well assessed due to lack
of IV contrast.

Limited intracranial: Unremarkable.

Visualized orbits: Unremarkable.

Mastoids and visualized paranasal sinuses: Right maxillary sinus
completely opacified and hypoplastic, compatible with silent sinus
syndrome. Remainder of the visualized paranasal sinuses are clear.
Visualized mastoids are clear.

Skeleton: No acute osseous finding. No discrete lytic or blastic
osseous lesions. Mild to moderate cervical spondylolysis present at
C5-6.

Upper chest: Unremarkable.

Other: None.
IMPRESSION: 1. No radiopaque foreign body identified within the visualized aero
digestive tract.
2. No other acute abnormality identified within the neck.
3. Chronic silent sinus syndrome involving the right maxillary
sinus.

## 2021-03-24 DIAGNOSIS — D127 Benign neoplasm of rectosigmoid junction: Secondary | ICD-10-CM | POA: Diagnosis not present

## 2021-03-24 DIAGNOSIS — Z0000 Encounter for general adult medical examination without abnormal findings: Secondary | ICD-10-CM | POA: Diagnosis not present

## 2021-03-24 DIAGNOSIS — Z125 Encounter for screening for malignant neoplasm of prostate: Secondary | ICD-10-CM | POA: Diagnosis not present

## 2021-03-24 DIAGNOSIS — E7800 Pure hypercholesterolemia, unspecified: Secondary | ICD-10-CM | POA: Diagnosis not present

## 2021-08-01 DIAGNOSIS — K5730 Diverticulosis of large intestine without perforation or abscess without bleeding: Secondary | ICD-10-CM | POA: Diagnosis not present

## 2021-08-01 DIAGNOSIS — Z86010 Personal history of colonic polyps: Secondary | ICD-10-CM | POA: Diagnosis not present

## 2021-08-01 DIAGNOSIS — Z980 Intestinal bypass and anastomosis status: Secondary | ICD-10-CM | POA: Diagnosis not present

## 2021-08-01 DIAGNOSIS — D123 Benign neoplasm of transverse colon: Secondary | ICD-10-CM | POA: Diagnosis not present

## 2021-10-11 DIAGNOSIS — K4090 Unilateral inguinal hernia, without obstruction or gangrene, not specified as recurrent: Secondary | ICD-10-CM | POA: Diagnosis not present

## 2021-11-06 NOTE — Progress Notes (Signed)
Surgery orders requested via Epic inbox. °

## 2021-11-08 ENCOUNTER — Ambulatory Visit: Payer: Self-pay | Admitting: Surgery

## 2021-11-08 ENCOUNTER — Encounter (HOSPITAL_COMMUNITY): Payer: BC Managed Care – PPO

## 2021-11-10 NOTE — Patient Instructions (Signed)
DUE TO COVID-19 ONLY TWO VISITORS  (aged 52 and older)  ARE ALLOWED TO COME WITH YOU AND STAY IN THE WAITING ROOM ONLY DURING PRE OP AND PROCEDURE.   **NO VISITORS ARE ALLOWED IN THE SHORT STAY AREA OR RECOVERY ROOM!!**  IF YOU WILL BE ADMITTED INTO THE HOSPITAL YOU ARE ALLOWED ONLY FOUR SUPPORT PEOPLE DURING VISITATION HOURS ONLY (7 AM -8PM)   The support person(s) must pass our screening, gel in and out, and wear a mask at all times, including in the patient's room. Patients must also wear a mask when staff or their support person are in the room. Visitors GUEST BADGE MUST BE WORN VISIBLY  One adult visitor may remain with you overnight and MUST be in the room by 8 P.M.     Your procedure is scheduled on: 11/23/21   Report to [facility] Main Entrance    Report to admitting at 8:00 AM   Call this number if you have problems the morning of surgery [phone]   Do not eat food :or drink After Midnight.              If you have questions, please contact your surgeon's office.       Oral Hygiene is also important to reduce your risk of infection.                                    Remember - BRUSH YOUR TEETH THE MORNING OF SURGERY WITH YOUR REGULAR TOOTHPASTE   Do NOT smoke after Midnight   Take these medicines the morning of surgery with A SIP OF WATER: none   Bring CPAP mask and tubing day of surgery if you use one.                              You may not have any metal on your body including , jewelry, and body piercing             Do not wear  lotions, powders, cologne, or deodorant               Men may shave face and neck.   Do not bring valuables to the hospital. [place].   Contacts, dentures or bridgework may not be worn into surgery.   Bring small overnight bag day of surgery.   DO NOT Heil.    Patients discharged on the day of surgery will not be allowed  to drive home.  Someone NEEDS to stay with you for the first 24 hours after anesthesia.   Special Instructions: Bring a copy of your healthcare power of attorney and living will documents  the day of surgery if you haven't scanned them before.              Please read over the following fact sheets you were given: IF YOU HAVE QUESTIONS ABOUT YOUR PRE-OP INSTRUCTIONS PLEASE CALL [phone]     [facility] - Preparing for Surgery Before surgery, you can play an important role.  Because skin is not sterile, your skin needs to be as free of germs as possible.  You can reduce the number of germs on your skin by washing with CHG (chlorahexidine  gluconate) soap before surgery.  CHG is an antiseptic cleaner which kills germs and bonds with the skin to continue killing germs even after washing. Please DO NOT use if you have an allergy to CHG or antibacterial soaps.  If your skin becomes reddened/irritated stop using the CHG and inform your nurse when you arrive at Short Stay.  You may shave your face/neck. Please follow these instructions carefully:  1.  Shower with CHG Soap the night before surgery and the  morning of Surgery.  2.  If you choose to wash your hair, wash your hair first as usual with your  normal  shampoo.  3.  After you shampoo, rinse your hair and body thoroughly to remove the  shampoo.                            4.  Use CHG as you would any other liquid soap.  You can apply chg directly  to the skin and wash                       Gently with a scrungie or clean washcloth.  5.  Apply the CHG Soap to your body ONLY FROM THE NECK DOWN.   Do not use on face/ open                           Wound or open sores. Avoid contact with eyes, ears mouth and genitals (private parts).                       Wash face,  Genitals (private parts) with your normal soap.             6.  Wash thoroughly, paying special attention to the area where your surgery  will be performed.  7.  Thoroughly rinse your  body with warm water from the neck down.  8.  DO NOT shower/wash with your normal soap after using and rinsing off  the CHG Soap.                9.  Pat yourself dry with a clean towel.            10.  Wear clean pajamas.            11.  Place clean sheets on your bed the night of your first shower and do not  sleep with pets. Day of Surgery : Do not apply any lotions/deodorants the morning of surgery.  Please wear clean clothes to the hospital/surgery center.  FAILURE TO FOLLOW THESE INSTRUCTIONS MAY RESULT IN THE CANCELLATION OF YOUR SURGERY   ________________________________________________________________________

## 2021-11-13 ENCOUNTER — Encounter (HOSPITAL_COMMUNITY): Payer: Self-pay

## 2021-11-13 ENCOUNTER — Other Ambulatory Visit: Payer: Self-pay

## 2021-11-13 ENCOUNTER — Encounter (HOSPITAL_COMMUNITY)
Admission: RE | Admit: 2021-11-13 | Discharge: 2021-11-13 | Disposition: A | Discharge status: Home / Self Care | Payer: BC Managed Care – PPO | Source: Ambulatory Visit | Attending: Surgery | Admitting: Surgery

## 2021-11-13 DIAGNOSIS — Z01818 Encounter for other preprocedural examination: Secondary | ICD-10-CM | POA: Diagnosis not present

## 2021-11-13 LAB — CBC
HCT: 43.5 % (ref 39.0–52.0)
Hemoglobin: 14.3 g/dL (ref 13.0–17.0)
MCH: 30.2 pg (ref 26.0–34.0)
MCHC: 32.9 g/dL (ref 30.0–36.0)
MCV: 92 fL (ref 80.0–100.0)
Platelets: 267 10*3/uL (ref 150–400)
RBC: 4.73 MIL/uL (ref 4.22–5.81)
RDW: 12 % (ref 11.5–15.5)
WBC: 4.2 10*3/uL (ref 4.0–10.5)
nRBC: 0 % (ref 0.0–0.2)

## 2021-11-13 LAB — BASIC METABOLIC PANEL
Anion gap: 5 (ref 5–15)
BUN: 15 mg/dL (ref 6–20)
CO2: 28 mmol/L (ref 22–32)
Calcium: 9.1 mg/dL (ref 8.9–10.3)
Chloride: 104 mmol/L (ref 98–111)
Creatinine, Ser: 1.12 mg/dL (ref 0.61–1.24)
GFR, Estimated: >60 mL/min (ref >60)
Glucose, Bld: 105 mg/dL — ABNORMAL HIGH (ref 70–99)
Potassium: 4.1 mmol/L (ref 3.5–5.1)
Sodium: 137 mmol/L (ref 135–145)

## 2021-11-13 NOTE — Progress Notes (Signed)
Anesthesia note:  Bowel prep reminder:NA  PCP - Dr. Solon Palm Cardiologist - Other-   Chest x-ray - no EKG - no Stress Test - no ECHO - no Cardiac Cath - NA  Pacemaker/ICD device last checked:NA  Sleep Study - no CPAP -   Pt is pre diabetic-NA Fasting Blood Sugar -  Checks Blood Sugar _____  Blood Thinner:NA Blood Thinner Instructions: Aspirin Instructions: Last Dose:  Anesthesia review: no  Patient denies shortness of breath, fever, cough and chest pain at PAT appointment Pt has no SOB with any activities  Patient verbalized understanding of instructions that were given to them at the PAT appointment. Patient was also instructed that they will need to review over the PAT instructions again at home before surgery. yes

## 2021-11-23 ENCOUNTER — Ambulatory Visit (HOSPITAL_COMMUNITY): Payer: BC Managed Care – PPO | Admitting: Certified Registered Nurse Anesthetist

## 2021-11-23 ENCOUNTER — Other Ambulatory Visit: Payer: Self-pay

## 2021-11-23 ENCOUNTER — Encounter (HOSPITAL_COMMUNITY): Payer: Self-pay | Admitting: Surgery

## 2021-11-23 ENCOUNTER — Encounter (HOSPITAL_COMMUNITY)
Admission: RE | Disposition: A | Discharge status: Home / Self Care | Payer: Self-pay | Source: Home / Self Care | Attending: Surgery

## 2021-11-23 ENCOUNTER — Ambulatory Visit (HOSPITAL_COMMUNITY)
Admission: RE | Admit: 2021-11-23 | Discharge: 2021-11-23 | Disposition: A | Discharge status: Home / Self Care | Payer: BC Managed Care – PPO | Attending: Surgery | Admitting: Surgery

## 2021-11-23 ENCOUNTER — Other Ambulatory Visit (HOSPITAL_COMMUNITY): Payer: Self-pay

## 2021-11-23 DIAGNOSIS — Z86010 Personal history of colonic polyps: Secondary | ICD-10-CM | POA: Diagnosis not present

## 2021-11-23 DIAGNOSIS — K4090 Unilateral inguinal hernia, without obstruction or gangrene, not specified as recurrent: Secondary | ICD-10-CM | POA: Insufficient documentation

## 2021-11-23 DIAGNOSIS — K4091 Unilateral inguinal hernia, without obstruction or gangrene, recurrent: Secondary | ICD-10-CM | POA: Diagnosis not present

## 2021-11-23 DIAGNOSIS — J45909 Unspecified asthma, uncomplicated: Secondary | ICD-10-CM | POA: Insufficient documentation

## 2021-11-23 DIAGNOSIS — Z01818 Encounter for other preprocedural examination: Secondary | ICD-10-CM

## 2021-11-23 HISTORY — PX: INGUINAL HERNIA REPAIR: SHX194

## 2021-11-23 SURGERY — REPAIR, HERNIA, INGUINAL, ADULT
Anesthesia: General | Laterality: Left

## 2021-11-23 MED ORDER — BUPIVACAINE LIPOSOME 1.3 % IJ SUSP
INTRAMUSCULAR | Status: AC
  Filled 2021-11-23: qty 20

## 2021-11-23 MED ORDER — DEXAMETHASONE SODIUM PHOSPHATE 10 MG/ML IJ SOLN
INTRAMUSCULAR | Status: DC | PRN
  Administered 2021-11-23: 10 mg via INTRAVENOUS

## 2021-11-23 MED ORDER — BUPIVACAINE-EPINEPHRINE (PF) 0.5% -1:200000 IJ SOLN
INTRAMUSCULAR | Status: AC
  Filled 2021-11-23: qty 30

## 2021-11-23 MED ORDER — OXYCODONE HCL 5 MG PO TABS
ORAL_TABLET | ORAL | Status: AC
  Filled 2021-11-23: qty 1

## 2021-11-23 MED ORDER — FENTANYL CITRATE (PF) 100 MCG/2ML IJ SOLN
INTRAMUSCULAR | Status: AC
  Filled 2021-11-23: qty 2

## 2021-11-23 MED ORDER — CHLORHEXIDINE GLUCONATE CLOTH 2 % EX PADS: 6.0000 | MEDICATED_PAD | Freq: Once | CUTANEOUS | Status: DC

## 2021-11-23 MED ORDER — CEFAZOLIN SODIUM-DEXTROSE 2-4 GM/100ML-% IV SOLN
2.0000 g | INTRAVENOUS | Status: AC
  Administered 2021-11-23: 2 g via INTRAVENOUS
  Filled 2021-11-23: qty 100

## 2021-11-23 MED ORDER — MIDAZOLAM HCL 5 MG/5ML IJ SOLN
INTRAMUSCULAR | Status: DC | PRN
  Administered 2021-11-23: 2 mg via INTRAVENOUS

## 2021-11-23 MED ORDER — DEXAMETHASONE SODIUM PHOSPHATE 10 MG/ML IJ SOLN
INTRAMUSCULAR | Status: AC
  Filled 2021-11-23: qty 1

## 2021-11-23 MED ORDER — OXYCODONE HCL 5 MG/5ML PO SOLN: 5.0000 mg | Freq: Once | ORAL | Status: AC | PRN

## 2021-11-23 MED ORDER — LIDOCAINE 2% (20 MG/ML) 5 ML SYRINGE
INTRAMUSCULAR | Status: DC | PRN
  Administered 2021-11-23: 60 mg via INTRAVENOUS

## 2021-11-23 MED ORDER — ROCURONIUM BROMIDE 10 MG/ML (PF) SYRINGE
PREFILLED_SYRINGE | INTRAVENOUS | Status: DC | PRN
  Administered 2021-11-23: 50 mg via INTRAVENOUS

## 2021-11-23 MED ORDER — MIDAZOLAM HCL 2 MG/2ML IJ SOLN
INTRAMUSCULAR | Status: AC
  Filled 2021-11-23: qty 2

## 2021-11-23 MED ORDER — SUGAMMADEX SODIUM 200 MG/2ML IV SOLN
INTRAVENOUS | Status: DC | PRN
  Administered 2021-11-23: 200 mg via INTRAVENOUS

## 2021-11-23 MED ORDER — PROPOFOL 10 MG/ML IV BOLUS
INTRAVENOUS | Status: AC
  Filled 2021-11-23: qty 20

## 2021-11-23 MED ORDER — BUPIVACAINE LIPOSOME 1.3 % IJ SUSP
INTRAMUSCULAR | Status: DC | PRN
  Administered 2021-11-23: 20 mL

## 2021-11-23 MED ORDER — CHLORHEXIDINE GLUCONATE 0.12 % MT SOLN
15.0000 mL | Freq: Once | OROMUCOSAL | Status: AC
  Administered 2021-11-23: 15 mL via OROMUCOSAL

## 2021-11-23 MED ORDER — 0.9 % SODIUM CHLORIDE (POUR BTL) OPTIME
TOPICAL | Status: DC | PRN
  Administered 2021-11-23: 1000 mL

## 2021-11-23 MED ORDER — PROMETHAZINE HCL 25 MG/ML IJ SOLN: 6.2500 mg | INTRAMUSCULAR | Status: DC | PRN

## 2021-11-23 MED ORDER — OXYCODONE HCL 5 MG PO TABS
5.0000 mg | ORAL_TABLET | Freq: Once | ORAL | Status: AC | PRN
  Administered 2021-11-23: 5 mg via ORAL

## 2021-11-23 MED ORDER — ORAL CARE MOUTH RINSE: 15.0000 mL | Freq: Once | OROMUCOSAL | Status: AC

## 2021-11-23 MED ORDER — FENTANYL CITRATE (PF) 100 MCG/2ML IJ SOLN
INTRAMUSCULAR | Status: DC | PRN
  Administered 2021-11-23: 100 ug via INTRAVENOUS
  Administered 2021-11-23: 50 ug via INTRAVENOUS

## 2021-11-23 MED ORDER — AMISULPRIDE (ANTIEMETIC) 5 MG/2ML IV SOLN: 10.0000 mg | Freq: Once | INTRAVENOUS | Status: DC | PRN

## 2021-11-23 MED ORDER — KETOROLAC TROMETHAMINE 30 MG/ML IJ SOLN
INTRAMUSCULAR | Status: AC
  Filled 2021-11-23: qty 1

## 2021-11-23 MED ORDER — ONDANSETRON HCL 4 MG/2ML IJ SOLN
INTRAMUSCULAR | Status: AC
  Filled 2021-11-23: qty 2

## 2021-11-23 MED ORDER — PROPOFOL 10 MG/ML IV BOLUS
INTRAVENOUS | Status: DC | PRN
  Administered 2021-11-23: 200 mg via INTRAVENOUS

## 2021-11-23 MED ORDER — BUPIVACAINE-EPINEPHRINE 0.5% -1:200000 IJ SOLN
INTRAMUSCULAR | Status: DC | PRN
  Administered 2021-11-23: 10 mL

## 2021-11-23 MED ORDER — ACETAMINOPHEN 500 MG PO TABS
1000.0000 mg | ORAL_TABLET | ORAL | Status: AC
  Administered 2021-11-23: 1000 mg via ORAL
  Filled 2021-11-23: qty 2

## 2021-11-23 MED ORDER — KETOROLAC TROMETHAMINE 30 MG/ML IJ SOLN
30.0000 mg | Freq: Once | INTRAMUSCULAR | Status: AC | PRN
  Administered 2021-11-23: 30 mg via INTRAVENOUS

## 2021-11-23 MED ORDER — TRAMADOL HCL 50 MG PO TABS
50.0000 mg | ORAL_TABLET | Freq: Four times a day (QID) | ORAL | 0 refills | Status: AC | PRN
  Filled 2021-11-23: qty 15, 4d supply, fill #0

## 2021-11-23 MED ORDER — FENTANYL CITRATE PF 50 MCG/ML IJ SOSY: 25.0000 ug | PREFILLED_SYRINGE | INTRAMUSCULAR | Status: DC | PRN

## 2021-11-23 MED ORDER — ROCURONIUM BROMIDE 10 MG/ML (PF) SYRINGE
PREFILLED_SYRINGE | INTRAVENOUS | Status: AC
  Filled 2021-11-23: qty 10

## 2021-11-23 MED ORDER — LACTATED RINGERS IV SOLN: INTRAVENOUS | Status: DC

## 2021-11-23 SURGICAL SUPPLY — 29 items
BAG COUNTER SPONGE SURGICOUNT (BAG) IMPLANT
COVER SURGICAL LIGHT HANDLE (MISCELLANEOUS) ×1 IMPLANT
DERMABOND ADVANCED .7 DNX12 (GAUZE/BANDAGES/DRESSINGS) ×1 IMPLANT
DRAIN PENROSE 0.5X18 (DRAIN) ×1 IMPLANT
DRAPE LAPAROTOMY TRNSV 102X78 (DRAPES) ×1 IMPLANT
ELECT REM PT RETURN 15FT ADLT (MISCELLANEOUS) ×1 IMPLANT
GLOVE BIO SURGEON STRL SZ7.5 (GLOVE) ×1 IMPLANT
GLOVE INDICATOR 8.0 STRL GRN (GLOVE) ×1 IMPLANT
GOWN STRL REUS W/ TWL XL LVL3 (GOWN DISPOSABLE) ×2 IMPLANT
GOWN STRL REUS W/TWL XL LVL3 (GOWN DISPOSABLE) ×2
KIT BASIN OR (CUSTOM PROCEDURE TRAY) ×1 IMPLANT
KIT TURNOVER KIT A (KITS) IMPLANT
MARKER SKIN DUAL TIP RULER LAB (MISCELLANEOUS) ×1 IMPLANT
MESH ULTRAPRO 3X6 7.6X15CM (Mesh General) IMPLANT
NEEDLE HYPO 22GX1.5 SAFETY (NEEDLE) ×1 IMPLANT
NS IRRIG 1000ML POUR BTL (IV SOLUTION) ×1 IMPLANT
PACK GENERAL/GYN (CUSTOM PROCEDURE TRAY) ×1 IMPLANT
SPIKE FLUID TRANSFER (MISCELLANEOUS) ×1 IMPLANT
SUT ETHIBOND 0 MO6 C/R (SUTURE) ×2 IMPLANT
SUT MNCRL AB 4-0 PS2 18 (SUTURE) ×1 IMPLANT
SUT VIC AB 2-0 SH 27 (SUTURE) ×1
SUT VIC AB 2-0 SH 27X BRD (SUTURE) ×1 IMPLANT
SUT VIC AB 3-0 SH 27 (SUTURE) ×1
SUT VIC AB 3-0 SH 27X BRD (SUTURE) ×1 IMPLANT
SYR 20ML LL LF (SYRINGE) ×1 IMPLANT
TOWEL OR 17X26 10 PK STRL BLUE (TOWEL DISPOSABLE) ×1 IMPLANT
TOWEL OR NON WOVEN STRL DISP B (DISPOSABLE) ×1 IMPLANT
TRAY FOLEY MTR SLVR 14FR STAT (SET/KITS/TRAYS/PACK) ×1 IMPLANT
TRAY FOLEY MTR SLVR 16FR STAT (SET/KITS/TRAYS/PACK) ×1 IMPLANT

## 2021-11-23 NOTE — Anesthesia Procedure Notes (Signed)
Procedure Name: Intubation Date/Time: 11/23/2021 9:47 AM  Performed by: Gerald Leitz, CRNAPre-anesthesia Checklist: Patient identified, Patient being monitored, Timeout performed, Emergency Drugs available and Suction available Patient Re-evaluated:Patient Re-evaluated prior to induction Oxygen Delivery Method: Circle system utilized Preoxygenation: Pre-oxygenation with 100% oxygen Induction Type: IV induction Ventilation: Mask ventilation without difficulty Laryngoscope Size: Mac and 3 Grade View: Grade I Tube type: Oral Tube size: 7.5 mm Number of attempts: 1 Airway Equipment and Method: Stylet Placement Confirmation: ETT inserted through vocal cords under direct vision, positive ETCO2 and breath sounds checked- equal and bilateral Secured at: 23 cm Tube secured with: Tape Dental Injury: Teeth and Oropharynx as per pre-operative assessment

## 2021-11-23 NOTE — Op Note (Signed)
11/23/2021  11:01 AM  PATIENT:  Johnny Weiss  51 y.o. male  Patient Care Team: Jonathon Jordan, MD as PCP - General (Family Medicine)  PRE-OPERATIVE DIAGNOSIS:  Left inguinal hernia, symptomatic  POST-OPERATIVE DIAGNOSIS:  Same  PROCEDURE:  Open left inguinal hernia repair with mesh  SURGEON:  Sharon [place] Catarino, MD  ANESTHESIA:  General endotracheal  COUNTS:  Sponge, needle and instrument counts were reported correct x2 at the conclusion of the operation.  EBL: 10 mL  DRAINS: None  SPECIMEN: None  FINDINGS: Indirect left inguinal hernia with cord lipoma. Standard Lichtenstein repair carried out  DESCRIPTION: The patient was identified in preop holding and taken to the OR where they were placed supine on the operating room table and SCDs were placed. General endotracheal anesthesia was induced without difficulty. Hair in the region of the planned incisions was clipped. The patient was then prepped and draped in the usual sterile fashion. A surgical timeout was performed indicating the correct patient, procedure, positioning and need for preoperative antibiotics.  0.5% Marcaine with epinephrine mixed with Exparel was used to anesthetize the skin over the mid-portion of the inguinal canal. An oblique incision was made. Dissection was carried down through the subcutaneous tissue with cautery to the external oblique fascia.  The external oblique fascia was opened along the direction of its fibers to the external ring.  The spermatic cord and hernia sac was circumferentially dissected bluntly and retracted with a Penrose drain.  The ilioinguinal nerve was identified and found to be running directly across the floor in the path of the mesh and was therefore ligated divided in an effort to reduce the potential for chronic inguinodynia.  The floor of the inguinal canal was inspected and an indrect hernia is identified with additionally a cord lipoma.  The cord structures were all identified  and then dissected. The hernia sac was identified, care being taken not to skeletonize the cord, this was dissected free from the cord structures. The hernia was reduced and the sac empty. The empty sac was then carefully opened sharply at the distal end of the sac where it was thinnest. High ligation of the sac was performed under direct visualization and the sac suture ligated with a 2-0 Vicryl suture. A piece of light weight prolene mesh (Ultrapro) was cut to fit the inguinal floor and deployed. The mesh was secured to the pubic tubercle using two 0 Ethibond sutures - ensuring that there was 2 cm of overlap between the mesh and the pubic tubercle. The mesh was then secured using additional 0 Ethibond suture to the shelving edge of the inguinal ligament inferiorly and the conjoined tendon superiorly. The mesh was tucked underneath the external oblique fascia laterally.  The tails of the mesh were then closed around the spermatic cord to recreate the internal inguinal ring and this ring was secured using 0 Ethibond suture.  The external oblique fascia was reapproximated with 2-0 Vicryl.  3-0 Vicryl was then used to close Scarpa's fascia. A running 4-0 Monocryl subcuticular suture was used to close the skin.  Dermabond was the applied. He was awakened from general anesthesia, extubated and taken to the recovery in satisfactory condition.  DISPOSITION: PACU in satisfactory condition

## 2021-11-23 NOTE — Anesthesia Preprocedure Evaluation (Addendum)
Anesthesia Evaluation  Patient identified by MRN, date of birth, ID band Patient awake    Reviewed: Allergy & Precautions, NPO status , Patient's Chart, lab work & pertinent test results  Airway Mallampati: II  TM Distance: >3 FB Neck ROM: Full    Dental no notable dental hx.    Pulmonary asthma    Pulmonary exam normal        Cardiovascular negative cardio ROS Normal cardiovascular exam     Neuro/Psych negative neurological ROS  negative psych ROS   GI/Hepatic negative GI ROS, Neg liver ROS,,,  Endo/Other  negative endocrine ROS    Renal/GU negative Renal ROS     Musculoskeletal negative musculoskeletal ROS (+)    Abdominal   Peds  Hematology negative hematology ROS (+)   Anesthesia Other Findings SYMPTOMATIC LEFT INGUINAL HERNIA  Reproductive/Obstetrics                             Anesthesia Physical Anesthesia Plan  ASA: 2  Anesthesia Plan: General   Post-op Pain Management:    Induction: Intravenous  PONV Risk Score and Plan: 3 and Ondansetron, Dexamethasone, Midazolam and Treatment may vary due to age or medical condition  Airway Management Planned: Oral ETT  Additional Equipment:   Intra-op Plan:   Post-operative Plan: Extubation in OR  Informed Consent: I have reviewed the patients History and Physical, chart, labs and discussed the procedure including the risks, benefits and alternatives for the proposed anesthesia with the patient or authorized representative who has indicated his/her understanding and acceptance.     Dental advisory given  Plan Discussed with: CRNA and Surgeon  Anesthesia Plan Comments:        Anesthesia Quick Evaluation

## 2021-11-23 NOTE — Anesthesia Postprocedure Evaluation (Signed)
Anesthesia Post Note  Patient: Johnny Weiss  Procedure(s) Performed: HERNIA REPAIR INGUINAL ADULT (Left)     Patient location during evaluation: PACU Anesthesia Type: General Level of consciousness: awake Pain management: pain level controlled Vital Signs Assessment: post-procedure vital signs reviewed and stable Respiratory status: spontaneous breathing, nonlabored ventilation, respiratory function stable and patient connected to nasal cannula oxygen Cardiovascular status: blood pressure returned to baseline and stable Postop Assessment: no apparent nausea or vomiting Anesthetic complications: no  No notable events documented.  Last Vitals:  Vitals:   11/23/21 1215 11/23/21 1230  BP: 121/71 116/67  Pulse: (!) 42 (!) 58  Resp: 14 14  Temp:  37 C  SpO2: 100% 100%    Last Pain:  Vitals:   11/23/21 1210  TempSrc:   PainSc: 5                  [name] P Lindia Garms

## 2021-11-23 NOTE — H&P (Signed)
CC: Here today for surgery  HPI: Johnny Weiss is an 51 y.o. male who underwent his first colonoscopy for screening purposes with Dr. Paulita Fujita 04/14/20. 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 This was tattooed.   He denies any symptoms related to this. He denies any history of rectal bleeding, blood in his stool, appetite or weight changes. He also denies any history of groin pain since I saw him in 2019. He reports a stable reducible bulge on the left side  I did flex sig 06/08/20 where a 4 cm polypoid lesion was found in the distal most sigmoid colon, just above the proximal most valve of [name]. Additional biopsies were taken and returned tubulovillous adenoma with at least high-grade dysplasia.   OR 07/21/20 Robotic LAR Findings: Tattoo visualized just above peritoneal reflection. Mass just proximal to the tattoo. Low anterior resection then carried out to ensure adequate distal margin, grossly 5 cm beyond the mass. A 31 mm EEA colorectal anastomosis fashioned 10 cm from the anal verge by flexible sigmoidoscopy.  Path: TVA with diffuse HGD; 0/16 LNs  INTERVAL HX Here today to for surgery for his left inguinal hernia. He has had it now for a few years and it has become more noticeable particularly when he is working out. Has caused some discomfort after prolonged activity. He has also noticed it has increased in size. He would like to have it repaired. He denies any other complaints at present. He did report he had a recent colonoscopy with Dr. Paulita Fujita for surveillance purposes of his colon and was told he had a small polyp removed but it was otherwise clear. He denies any changes in his health or health history since we met in the office 10/11/21.  PMH: Denies  PSH: Wisdom teeth surgery  FHx: Denies any known family  history of colorectal, breast, endometrial or ovarian cancer  Social Hx: Denies use of tobacco/drugs; social EtOH use. He works as an Copy   Past Medical History:  Diagnosis Date   Asthma    triggered by cat hair    Past Surgical History:  Procedure Laterality Date   BIOPSY  06/08/2020   Procedure: BIOPSY;  Surgeon: Ileana Roup, MD;  Location: [facility];  Service: General;;   FLEXIBLE SIGMOIDOSCOPY N/A 06/08/2020   Procedure: FLEXIBLE SIGMOIDOSCOPY BIOPSY DIAGNOSTIC;  Surgeon: Ileana Roup, MD;  Location: [facility];  Service: General;  Laterality: N/A;   FLEXIBLE SIGMOIDOSCOPY N/A 07/21/2020   Procedure: FLEXIBLE SIGMOIDOSCOPY;  Surgeon: Ileana Roup, MD;  Location: [facility];  Service: General;  Laterality: N/A;   XI ROBOTIC ASSISTED LOWER ANTERIOR RESECTION N/A 07/21/2020   Procedure: XI ROBOTIC ASSISTED LOWER ANTERIOR RESECTION, INTRAOPERATIVE ASSESSMENT OF PERFUSION;  Surgeon: Ileana Roup, MD;  Location: [facility];  Service: General;  Laterality: N/A;    History reviewed. No pertinent family history.  Social:  reports that he has never smoked. He has never used smokeless tobacco. He reports current alcohol use. He reports that he does not use drugs.  Allergies:  Allergies  Allergen Reactions   Cat Hair Extract Cough    Uses an inhaler as needed    Medications: I have reviewed the patient's current medications.  No results found for this or any previous visit (from the past 48 hour(s)).  No results  found.  PE Blood pressure 121/74, pulse (!) 50, temperature 97.9 F (36.6 C), temperature source Oral, resp. rate 16, height 6' (1.829 m), weight 70.8 kg, SpO2 100 %. Constitutional: NAD; conversant; no deformities Eyes: Moist conjunctiva; no lid lag; anicteric; PERRL Neck: Trachea midline; no thyromegaly Lungs: Normal respiratory effort; no tactile fremitus CV: RRR; no palpable thrills; no pitting edema GI: left  inguinal hernia soft/reducible, marked in preop  MSK: Normal range of motion of extremities Psychiatric: Appropriate affect; alert and oriented x3 No results found for this or any previous visit (from the past 48 hour(s)).  No results found.  A/P: Johnny Weiss is an 51 y.o. male s/p robotic LAR 07/21/20 - TVA with HGD - now here for evaluation of symptomatic left inguinal hernia  -The anatomy and physiology of the GI tract and abdominal wall was reviewed him. The pathophysiology of hernias was covered as well. -The options for treatment were reviewed including further observation (with risk of worsening pain, incarceration of hernia, potential need for urgent/emergent procedures) vs surgery -open left inguinal hernia repair with mesh. -We specifically spent time outlining the use of mesh with surgery. We discussed that a Lichtenstein based repair (with mesh) is certainly the superior way to address this problem with very low risks of recurrence. We also spent time discussing uncommon but possible mesh related complications including infections, erosion into other structures, need for additional procedures. -The planned procedure, material risks (including, but not limited to, pain, bleeding, infection, scarring, need for blood transfusion, damage to surrounding structures-blood vessels/nerves/viscus/organs, need for additional procedures, recurrence, thigh and/or scrotal numbness, chronic pain, mesh complications including erosion into other structures/vessels/organs/viscus, worsening of pre-existing medical conditions, pneumonia, heart attack, stroke, death) benefits and alternatives to surgery were discussed at length. I noted a good probability that the procedure help improve his symptoms. The patient's questions were answered to his satisfaction, he voiced understanding and they elected to proceed with surgery. Additionally, we discussed typical postoperative expectations and the recovery process.    [name], [facility], [employer]

## 2021-11-23 NOTE — Transfer of Care (Signed)
Immediate Anesthesia Transfer of Care Note  Patient: Johnny Weiss  Procedure(s) Performed: Procedure(s): HERNIA REPAIR INGUINAL ADULT (Left)  Patient Location: PACU  Anesthesia Type:General  Level of Consciousness: Alert, Awake, Oriented  Airway & Oxygen Therapy: Patient Spontanous Breathing  Post-op Assessment: Report given to RN  Post vital signs: Reviewed and stable  Last Vitals:  Vitals:   11/23/21 0828  BP: 121/74  Pulse: (!) 50  Resp: 16  Temp: 36.6 C  SpO2: 425%    Complications: No apparent anesthesia complications

## 2021-11-23 NOTE — Discharge Instructions (Addendum)
POST OP INSTRUCTIONS  DIET: As tolerated. Follow a light bland diet the first 24 hours after arrival home, such as soup, liquids, crackers, etc.  Be sure to include lots of fluids daily.  Avoid fast food or heavy meals as your are more likely to get nauseated.  Eat a low fat the next few days after surgery.  Take your usually prescribed home medications unless otherwise directed.  PAIN CONTROL: Pain is best controlled by a usual combination of three different methods TOGETHER: Ice/Heat Over the counter pain medication Prescription pain medication Most patients will experience some swelling and bruising around the surgical site.  Ice packs or heating pads (30-60 minutes up to 6 times a day) will help. Some people prefer to use ice alone, heat alone, alternating between ice & heat.  Experiment to what works for you.  Swelling and bruising can take several weeks to resolve.   It is helpful to take an over-the-counter pain medication regularly for the first few weeks: Ibuprofen (Motrin/Advil) - '200mg'$  tabs - take 3 tabs ('600mg'$ ) every 6 hours as needed for pain Acetaminophen (Tylenol) - you may take '650mg'$  every 6 hours as needed. You can take this with motrin as they act differently on the body. If you are taking a narcotic pain medication that has acetaminophen in it, do not take over the counter tylenol at the same time.  Iii. NOTE: You may take both of these medications together - most patients  find it most helpful when alternating between the two (i.e. Ibuprofen at 6am,  tylenol at 9am, ibuprofen at 12pm ..Marland Kitchen) A  prescription for pain medication should be given to you upon discharge.  Take your pain medication as prescribed if your pain is not adequatly controlled with the over-the-counter pain reliefs mentioned above.  Avoid getting constipated.  Between the surgery and the pain medications, it is common to experience some constipation.  Increasing fluid intake and taking a fiber supplement (such as  Metamucil, Citrucel, FiberCon, MiraLax, etc) 1-2 times a day regularly will usually help prevent this problem from occurring.  A mild laxative (prune juice, Milk of Magnesia, MiraLax, etc) should be taken according to package directions if there are no bowel movements after 48 hours.    Dressing: Your incision is covered in Dermabond which is like sterile superglue for the skin. This will come off on it's own in a couple weeks. It is waterproof and you may bathe normally starting the day after your surgery in a shower. Avoid baths/pools/lakes/oceans until your wounds have fully healed.  ACTIVITIES as tolerated:   Avoid heavy lifting (>10lbs or 1 gallon of milk) for the next 6 weeks. You may resume regular (light) daily activities beginning the next day--such as daily self-care, walking, climbing stairs--gradually increasing activities as tolerated.  If you can walk 30 minutes without difficulty, it is safe to try more intense activity such as jogging, treadmill, bicycling, low-impact aerobics.  DO NOT PUSH THROUGH PAIN.  Let pain be your guide: If it hurts to do something, don't do it. You may drive when you are no longer taking prescription pain medication, you can comfortably wear a seatbelt, and you can safely maneuver your car and apply brakes.   FOLLOW UP in our office Please call [facility] at [phone] to set up an appointment to see your surgeon in the office for a follow-up appointment approximately 2 weeks after your surgery. Make sure that you call for this appointment the day you arrive home to  insure a convenient appointment time.  9. If you have disability or family leave forms that need to be completed, you may have them completed by your primary care physician's office; for return to work instructions, please ask our office staff and they will be happy to assist you in obtaining this documentation   When to call us [phone]: Poor pain control Reactions / problems with new  medications (rash/itching, etc)  Fever over 101.5 F (38.5 C) Inability to urinate Nausea/vomiting Worsening swelling or bruising Continued bleeding from incision. Increased pain, redness, or drainage from the incision  The clinic staff is available to answer your questions during regular business hours (8:30am-5pm).  Please don't hesitate to call and ask to speak to one of our nurses for clinical concerns.   A surgeon from [facility] is always on call at the hospitals   If you have a medical emergency, go to the nearest emergency room or call 911.  [facility] [address], [address], [place], [place]  [address] MAIN: [phone] FAX: [phone] [email or web address]  

## 2021-11-24 ENCOUNTER — Encounter (HOSPITAL_COMMUNITY): Payer: Self-pay | Admitting: Surgery

## 2023-11-18 ENCOUNTER — Other Ambulatory Visit (HOSPITAL_COMMUNITY): Payer: Self-pay

## 2023-11-18 NOTE — Progress Notes (Unsigned)
 DVT Clinic Note  Name: Johnny Weiss     MRN: [id number]     DOB: [birth year]     Sex: male  PCP: Johnny Mems, MD  Today's Visit: Visit Information: Initial Visit  Referred to DVT Clinic by: Primary Care - Dr. Barabara Dama Referred to CPP by: Dr. Magda Reason for referral:  Chief Complaint  Patient presents with   Intramuscular vein thrombosis   HISTORY OF PRESENT ILLNESS: Johnny Weiss is a 53 y.o. male with PMH of asthma who presents after diagnosis of intramuscular vein thrombosis for medication management. Patient was seen at [facility] on 11/01/23 for right leg pain. Due to concern for DVT, he was started on Eliquis and referred for an outpatient ultrasound when available. 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 His PCP started him on Eliquis and recommended treatment for at least 3 months. PCP referred him to the DVT Clinic for further evaluation and management. Today, patient reports the pain began when he was walking off an 8 hour flight back from [place] on 10/27/23. He initially had mild swelling and cramping pain in his right calf, but these symptoms have now mostly resolved. He has been taking Eliquis for about 2 weeks now. Denies abnormal bleeding or bruising. Denies missed doses of Eliquis. He purchased compression stockings and has been wearing them when he knows he will be more immobile. He travels for work frequently and says most of the time these are shorter ~2 hour flights, but occasionally will have longer international flights. Reports his PCP already prescribed 3 months of Eliquis which he already picked up using the copay card. He also said he has an appointment scheduled with his PCP in 3 months for DVT follow up.  Positive Thrombotic Risk Factors: Sedentary journey lasting >8 hours within 4 weeks Bleeding Risk Factors: None Present  Negative Thrombotic Risk Factors: Recent  surgery (within 3 months), Recent trauma (within 3 months), Recent admission to hospital with acute illness (within 3 months), Paralysis, paresis, or recent plaster cast immobilization of lower extremity, Central venous catheterization, Pregnancy, Bed rest >72 hours within 3 months, Recent cesarean section (within 3 months), Within 6 weeks postpartum, Estrogen therapy, Recent COVID diagnosis (within 3 months), Testosterone therapy, Erythropoiesis-stimulating agent, Non-malignant, chronic inflammatory condition, Active cancer, Known thrombophilic condition, Smoking, Older age, Obesity  Rx Insurance Coverage: Commercial Rx Affordability: Eliquis is $376 for 90 days or $126 for 30 days. Patient already picked up a 3 month supply using the copay card. Rx Assistance Provided: None needed. Patient already used copay card. Preferred Pharmacy: PCP already prescribed refills.  Past Medical History:  Diagnosis Date   Asthma    triggered by cat hair    Past Surgical History:  Procedure Laterality Date   BIOPSY  06/08/2020   Procedure: BIOPSY;  Surgeon: Teresa Lonni HERO, MD;  Location: [facility];  Service: General;;   FLEXIBLE SIGMOIDOSCOPY N/A 06/08/2020   Procedure: FLEXIBLE SIGMOIDOSCOPY BIOPSY DIAGNOSTIC;  Surgeon: Teresa Lonni HERO, MD;  Location: [facility];  Service: General;  Laterality: N/A;   FLEXIBLE SIGMOIDOSCOPY N/A 07/21/2020   Procedure: FLEXIBLE SIGMOIDOSCOPY;  Surgeon: Teresa Lonni HERO, MD;  Location: [facility];  Service: General;  Laterality: N/A;   INGUINAL HERNIA REPAIR Left 11/23/2021   Procedure: HERNIA REPAIR INGUINAL ADULT;  Surgeon: Teresa Lonni HERO, MD;  Location: [facility];  Service: General;  Laterality: Left;   XI ROBOTIC ASSISTED LOWER ANTERIOR RESECTION N/A 07/21/2020  Procedure: XI ROBOTIC ASSISTED LOWER ANTERIOR RESECTION, INTRAOPERATIVE ASSESSMENT OF PERFUSION;  Surgeon: Teresa Lonni HERO, MD;  Location: [facility];  Service: General;  Laterality: N/A;     Social History   Socioeconomic History   Marital status: Married    Spouse name: Not on file   Number of children: Not on file   Years of education: Not on file   Highest education level: Not on file  Occupational History   Not on file  Tobacco Use   Smoking status: Never   Smokeless tobacco: Never  Vaping Use   Vaping status: Never Used  Substance and Sexual Activity   Alcohol use: Yes    Comment: occ   Drug use: Never   Sexual activity: Not on file  Other Topics Concern   Not on file  Social History Narrative   Not on file   Social Drivers of Health   Financial Resource Strain: Not on file  Food Insecurity: Not on file  Transportation Needs: Not on file  Physical Activity: Not on file  Stress: Not on file  Social Connections: Not on file  Intimate Partner Violence: Not on file    No family history on file.  Allergies as of 11/19/2023 - Review Complete 11/19/2023  Allergen Reaction Noted   Cat dander Cough 11/13/2021    Current Outpatient Medications on File Prior to Visit  Medication Sig Dispense Refill   cetirizine (ZYRTEC) 10 MG tablet Take 10 mg by mouth daily as needed for allergies.     budesonide-formoterol (SYMBICORT) 160-4.5 MCG/ACT inhaler Inhale 2 puffs into the lungs 2 (two) times daily. As needed     ELIQUIS 5 MG TABS tablet Take 5 mg by mouth 2 (two) times daily.     No current facility-administered medications on file prior to visit.   REVIEW OF SYSTEMS:  Review of Systems  Respiratory:  Negative for shortness of breath.   Cardiovascular:  Negative for chest pain and leg swelling.  Musculoskeletal:  Negative for myalgias.   PHYSICAL EXAMINATION:  Vitals:   11/19/23 0836  BP: 127/87  Pulse: 69  SpO2: 97%  Weight: 162 lb 14.4 oz (73.9 kg)    Body mass index is 22.09 kg/m.  Physical Exam Vitals reviewed.  Pulmonary:     Effort: Pulmonary effort is normal.  Musculoskeletal:        General: No tenderness.     Right lower leg: No  edema.  Neurological:     Mental Status: He is alert.  Psychiatric:        Mood and Affect: Mood normal.    Villalta Score for Post-Thrombotic Syndrome: Pain: Absent Cramps: Absent Heaviness: Absent Paresthesia: Absent Pruritus: Absent Skin Induration: Absent Hyperpigmentation: Absent Redness: Absent Venous Ectasia: Absent Pain on calf compression: Absent Is venous ulcer present?: No If venous ulcer is present and score is <15, then 15 points total are assigned: Absent  LABS:  CBC     Component Value Date/Time   WBC 4.2 11/13/2021 0817   RBC 4.73 11/13/2021 0817   HGB 14.3 11/13/2021 0817   HCT 43.5 11/13/2021 0817   PLT 267 11/13/2021 0817   MCV 92.0 11/13/2021 0817   MCH 30.2 11/13/2021 0817   MCHC 32.9 11/13/2021 0817   RDW 12.0 11/13/2021 0817   LYMPHSABS 1.8 07/13/2020 1029   MONOABS 0.6 07/13/2020 1029   EOSABS 0.2 07/13/2020 1029   BASOSABS 0.0 07/13/2020 1029    Hepatic Function      Component Value Date/Time  PROT 7.4 07/13/2020 1029   ALBUMIN 4.2 07/13/2020 1029   AST 22 07/13/2020 1029   ALT 29 07/13/2020 1029   ALKPHOS 53 07/13/2020 1029   BILITOT 0.4 07/13/2020 1029    Renal Function   Lab Results  Component Value Date   CREATININE 1.12 11/13/2021   CREATININE 0.97 07/23/2020   CREATININE 0.82 07/22/2020    CrCl cannot be calculated (Patient's most recent lab result is older than the maximum 21 days allowed.).   [facility] Vascular Lab Studies:  11/04/2023 US  PERIPHERAL VENOUS LEG UNILAT RIGHT ([facility]): RIGHT LOWER EXTREMITY . Common femoral vein: Patent. . Femoral vein: Patent. . Profunda femoral vein: Patent. . Greater saphenous vein: Patent. . Popliteal vein: Patent. . Gastrocnemius vein: Positive for thrombus. . Anterior tibial vein: Patent. . Posterior tibial vein: Patent. . Peroneal vein: Patent. . Venous compressibility: Negative for gastrocnemius vein. SABRA Respiratory variation: Normal. . Waveform response to augmentation:  Normal.  IMPRESSION: Positive for deep vein thrombosis of the gastrocnemius vein.   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 Provoking factors include recent 8 hour international flight. He was started on treatment with Eliquis and has been tolerating the medication well. No medication adherence barriers identified and he has already picked up a 3 month supply. His symptoms have significantly improved since starting on Eliquis. He has no swelling and only occasional mild calf pain. Intramuscular vein thrombosis is considered lower risk for extension/PE. Discussed the patient with Dr. Magda who recommended continuing Eliquis for 3 months given he was already started on anticoagulation and has been tolerating Eliquis well. Per Dr. Magda, if he begins to have bleeding side effects, would recommend stopping anticoagulation. He already has an appointment for DVT follow up in 3 months with his PCP, so no need for DVT Clinic follow up in 3 months. No follow up imaging recommended. Patient confirmed understanding and all of his questions were answered. Counseled on DVT risk reduction strategies for future long haul travel/prolonged sedentary periods. Encouraged use of compression stockings.  PLAN: -Continue apixaban (Eliquis) 5 mg twice daily. -Expected duration of therapy: 3 months. Therapy started on 11/04/2023. -Patient educated on purpose, proper use and potential adverse effects of apixaban (Eliquis). -Discussed importance of taking medication around the same time every day. -Advised patient of medications to avoid (NSAIDs, aspirin doses >100 mg daily). -Educated that Tylenol  (acetaminophen ) is the preferred analgesic to lower the risk of bleeding. -Advised patient to alert all providers of anticoagulation  therapy prior to starting a new medication or having a procedure. -Emphasized importance of monitoring for signs and symptoms of bleeding (abnormal bruising, prolonged bleeding, nose bleeds, bleeding from gums, discolored urine, black tarry stools). -Educated patient to present to the ED if emergent signs and symptoms of new thrombosis occur.  Follow up: PCP as scheduled, DVT Clinic available as needed  Izetta Henry, PharmD [facility] [phone]

## 2023-11-19 ENCOUNTER — Ambulatory Visit: Attending: Vascular Surgery | Admitting: Pharmacist

## 2023-11-19 VITALS — BP 127/87 | HR 69 | Wt 162.9 lb

## 2023-11-19 DIAGNOSIS — I82461 Acute embolism and thrombosis of right calf muscular vein: Secondary | ICD-10-CM

## 2023-11-19 NOTE — Patient Instructions (Signed)
-  Continue apixaban (Eliquis) 5 mg twice daily. -It is important to take your medication around the same time every day.  -Avoid NSAIDs like ibuprofen  (Advil , Motrin ) and naproxen (Aleve) as well as aspirin doses over 100 mg daily. -Tylenol  (acetaminophen ) is the preferred over the counter pain medication to lower the risk of bleeding. -Be sure to alert all of your health care providers that you are taking an anticoagulant prior to starting a new medication or having a procedure. -Monitor for signs and symptoms of bleeding (abnormal bruising, prolonged bleeding, nose bleeds, bleeding from gums, discolored urine, black tarry stools). If you have fallen and hit your head OR if your bleeding is severe or not stopping, seek emergency care.  -Go to the emergency room if emergent signs and symptoms of new clot occur (new or worse swelling and pain in an arm or leg, shortness of breath, chest pain, fast or irregular heartbeats, lightheadedness, dizziness, fainting, coughing up blood) or if you experience a significant color change (pale or blue) in the extremity. -We recommend you wear compression stockings (20-30 mmHg) as long as you are having swelling or pain. Be sure to purchase the correct size and take them off at night.   If you have any questions or need to reschedule an appointment, please call [phone]. If you are having an emergency, call 911 or present to the nearest emergency room.
